# Patient Record
Sex: Female | Born: 1955 | Race: White | Hispanic: No | Smoking: Never smoker
Health system: Southern US, Community
[De-identification: ages and names within clinical notes are randomized; demographics above are authoritative.]

## PROBLEM LIST (undated history)

## (undated) DIAGNOSIS — E785 Hyperlipidemia, unspecified: Secondary | ICD-10-CM

## (undated) DIAGNOSIS — T7840XA Allergy, unspecified, initial encounter: Secondary | ICD-10-CM

## (undated) HISTORY — PX: SHOULDER ARTHROSCOPY: SHX128

## (undated) HISTORY — PX: LAPAROSCOPY: SHX197

## (undated) HISTORY — DX: Allergy, unspecified, initial encounter: T78.40XA

## (undated) HISTORY — PX: CARPAL TUNNEL RELEASE: SHX101

## (undated) HISTORY — DX: Hyperlipidemia, unspecified: E78.5

## (undated) HISTORY — PX: BREAST EXCISIONAL BIOPSY: SUR124

## (undated) HISTORY — PX: BREAST BIOPSY: SHX20

---

## 2006-11-01 ENCOUNTER — Other Ambulatory Visit: Admission: RE | Admit: 2006-11-01 | Discharge: 2006-11-01 | Payer: Self-pay | Admitting: Obstetrics and Gynecology

## 2010-05-26 ENCOUNTER — Other Ambulatory Visit: Payer: Self-pay | Admitting: Obstetrics and Gynecology

## 2010-05-26 ENCOUNTER — Other Ambulatory Visit (HOSPITAL_COMMUNITY)
Admission: RE | Admit: 2010-05-26 | Discharge: 2010-05-26 | Disposition: A | Discharge status: Home / Self Care | Payer: Managed Care, Other (non HMO) | Source: Ambulatory Visit | Attending: Obstetrics and Gynecology | Admitting: Obstetrics and Gynecology

## 2010-05-26 ENCOUNTER — Encounter (INDEPENDENT_AMBULATORY_CARE_PROVIDER_SITE_OTHER): Payer: Managed Care, Other (non HMO) | Admitting: Obstetrics and Gynecology

## 2010-05-26 DIAGNOSIS — Z124 Encounter for screening for malignant neoplasm of cervix: Secondary | ICD-10-CM | POA: Insufficient documentation

## 2010-05-26 DIAGNOSIS — R823 Hemoglobinuria: Secondary | ICD-10-CM

## 2010-05-26 DIAGNOSIS — Z01419 Encounter for gynecological examination (general) (routine) without abnormal findings: Secondary | ICD-10-CM

## 2010-06-09 ENCOUNTER — Encounter (INDEPENDENT_AMBULATORY_CARE_PROVIDER_SITE_OTHER): Payer: Managed Care, Other (non HMO)

## 2010-06-09 DIAGNOSIS — M899 Disorder of bone, unspecified: Secondary | ICD-10-CM

## 2010-10-21 ENCOUNTER — Encounter: Payer: Self-pay | Admitting: Obstetrics and Gynecology

## 2013-07-04 DIAGNOSIS — Z0000 Encounter for general adult medical examination without abnormal findings: Secondary | ICD-10-CM | POA: Insufficient documentation

## 2013-07-04 DIAGNOSIS — M545 Low back pain, unspecified: Secondary | ICD-10-CM | POA: Insufficient documentation

## 2015-09-02 DIAGNOSIS — M159 Polyosteoarthritis, unspecified: Secondary | ICD-10-CM | POA: Insufficient documentation

## 2015-09-02 DIAGNOSIS — M4317 Spondylolisthesis, lumbosacral region: Secondary | ICD-10-CM | POA: Insufficient documentation

## 2015-09-02 DIAGNOSIS — E782 Mixed hyperlipidemia: Secondary | ICD-10-CM | POA: Insufficient documentation

## 2017-05-18 ENCOUNTER — Other Ambulatory Visit: Payer: Self-pay | Admitting: Orthopedic Surgery

## 2017-05-18 DIAGNOSIS — M7582 Other shoulder lesions, left shoulder: Secondary | ICD-10-CM

## 2017-05-21 ENCOUNTER — Ambulatory Visit
Admission: RE | Admit: 2017-05-21 | Discharge: 2017-05-21 | Disposition: A | Discharge status: Home / Self Care | Payer: BLUE CROSS/BLUE SHIELD | Source: Ambulatory Visit | Attending: Orthopedic Surgery | Admitting: Orthopedic Surgery

## 2017-05-21 DIAGNOSIS — M7582 Other shoulder lesions, left shoulder: Secondary | ICD-10-CM

## 2017-06-28 DIAGNOSIS — M7582 Other shoulder lesions, left shoulder: Secondary | ICD-10-CM | POA: Insufficient documentation

## 2019-07-23 ENCOUNTER — Other Ambulatory Visit: Payer: Self-pay

## 2019-07-23 ENCOUNTER — Emergency Department
Admission: EM | Admit: 2019-07-23 | Discharge: 2019-07-23 | Disposition: A | Discharge status: Home / Self Care | Payer: 59 | Source: Home / Self Care | Attending: Family Medicine | Admitting: Family Medicine

## 2019-07-23 DIAGNOSIS — F418 Other specified anxiety disorders: Secondary | ICD-10-CM | POA: Diagnosis not present

## 2019-07-23 MED ORDER — ESCITALOPRAM OXALATE 10 MG PO TABS: ORAL_TABLET | ORAL | 1 refills | Status: DC

## 2019-07-23 NOTE — ED Provider Notes (Signed)
[facility]    CSN: [id number] Arrival date & time: 07/23/19  1152      History   Chief Complaint Chief Complaint  Patient presents with  . Anxiety    HPI Miranda Cunningham is a 64 y.o. female.   Patient reports that she has become increasingly stressed during the past several months while her 38 year-old mother has become increasingly disabled.  Her mother is home-bound with multiple medical problems/disabilty, and patient has become her primary caretaker.  She has decreased her work hours to part-time in order to care for her mother.  She feels anxious and overwhelmed despite her part-time work status.  She feels mildly depressed at times but denies suicidal thoughts.  She has difficulty concentrating and her sleep is generally adequate. She reports that she will have an initial PCP visit on 08/04/19 to establish care.   Anxiety This is a chronic problem. Episode onset: approximately 5 months. The problem occurs daily. The problem has been gradually worsening. Pertinent negatives include no chest pain, no headaches and no shortness of breath. The symptoms are aggravated by stress. Nothing relieves the symptoms. She has tried nothing for the symptoms.    History reviewed. No pertinent past medical history.  There are no problems to display for this patient.   History reviewed. No pertinent surgical history.  OB History   No obstetric history on file.      Home Medications    Prior to Admission medications   Medication Sig Start Date End Date Taking? Authorizing Provider  escitalopram (LEXAPRO) 10 MG tablet Take one tab PO daily. 07/23/19   Kandra Nicolas, MD    Family History History reviewed. No pertinent family history.  Social History Social History   Tobacco Use  . Smoking status: Never Smoker  . Smokeless tobacco: Never Used  Substance Use Topics  . Alcohol use: Not Currently  . Drug use: Not on file     Allergies   Patient has no known  allergies.   Review of Systems Review of Systems  Constitutional: Positive for fatigue. Negative for activity change, appetite change, chills, diaphoresis and unexpected weight change.  Respiratory: Negative for chest tightness and shortness of breath.   Cardiovascular: Negative for chest pain and palpitations.  Gastrointestinal: Negative for nausea.  Neurological: Negative for headaches.  Psychiatric/Behavioral: Positive for decreased concentration. Negative for agitation, confusion, sleep disturbance and suicidal ideas. The patient is nervous/anxious. The patient is not hyperactive.   All other systems reviewed and are negative.    Physical Exam Triage Vital Signs ED Triage Vitals  Enc Vitals Group     BP 07/23/19 1205 (!) 142/78     Pulse Rate 07/23/19 1205 69     Resp 07/23/19 1205 18     Temp 07/23/19 1205 98.3 F (36.8 C)     Temp Source 07/23/19 1205 Oral     SpO2 07/23/19 1205 96 %     Weight --      Height --      Head Circumference --      Peak Flow --      Pain Score 07/23/19 1208 0     Pain Loc --      Pain Edu? --      Excl. in [place]? --    No data found.  Updated Vital Signs BP (!) 142/78 (BP Location: Right Arm)   Pulse 69   Temp 98.3 F (36.8 C) (Oral)   Resp 18  SpO2 96%   Visual Acuity Right Eye Distance:   Left Eye Distance:   Bilateral Distance:    Right Eye Near:   Left Eye Near:    Bilateral Near:     Physical Exam Constitutional:      General: She is not in acute distress. HENT:     Head: Normocephalic.  Eyes:     Pupils: Pupils are equal, round, and reactive to light.  Cardiovascular:     Rate and Rhythm: Normal rate.  Pulmonary:     Effort: Pulmonary effort is normal.  Neurological:     Mental Status: She is alert.  Psychiatric:        Attention and Perception: Attention normal.        Mood and Affect: Mood normal. Affect is flat.        Speech: Speech normal.        Behavior: Behavior normal.        Thought Content:  Thought content normal. Thought content does not include suicidal ideation.        Judgment: Judgment normal.      UC Treatments / Results  Labs (all labs ordered are listed, but only abnormal results are displayed) Labs Reviewed - No data to display  EKG   Radiology No results found.  Procedures Procedures (including critical care time)  Medications Ordered in UC Medications - No data to display  Initial Impression / Assessment and Plan / UC Course  I have reviewed the triage vital signs and the nursing notes.  Pertinent labs & imaging results that were available during my care of the patient were reviewed by me and considered in my medical decision making (see chart for details).    Begin trial of Lexapro 10mg  daily (#15, one refill).  Followup with PCP scheduled on 08/04/19.   Final Clinical Impressions(s) / UC Diagnoses   Final diagnoses:  Depression with anxiety     Discharge Instructions     Consider investigating the possibility of obtaining part-time home medical assistance for the care of your mother.   ED Prescriptions    Medication Sig Dispense Auth. Provider   escitalopram (LEXAPRO) 10 MG tablet Take one tab PO daily. 15 tablet Kandra Nicolas, MD        Kandra Nicolas, MD 07/23/19 1341

## 2019-07-23 NOTE — Discharge Instructions (Addendum)
Consider investigating the possibility of obtaining part-time home medical assistance for the care of your mother.

## 2019-07-23 NOTE — ED Triage Notes (Signed)
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 Pt thinks she has ADHD and is very overwhelmed. Having increased anxiety x 2-3 mos. Has establish care appt with PCP on 21st.

## 2019-08-04 ENCOUNTER — Ambulatory Visit (INDEPENDENT_AMBULATORY_CARE_PROVIDER_SITE_OTHER): Payer: 59 | Admitting: Medical

## 2019-08-04 ENCOUNTER — Other Ambulatory Visit: Payer: Self-pay

## 2019-08-04 VITALS — BP 139/68 | HR 70 | Resp 18 | Ht 63.0 in | Wt 213.6 lb

## 2019-08-04 DIAGNOSIS — F329 Major depressive disorder, single episode, unspecified: Secondary | ICD-10-CM | POA: Diagnosis not present

## 2019-08-04 DIAGNOSIS — F32A Depression, unspecified: Secondary | ICD-10-CM

## 2019-08-04 DIAGNOSIS — F419 Anxiety disorder, unspecified: Secondary | ICD-10-CM

## 2019-08-04 MED ORDER — ESCITALOPRAM OXALATE 10 MG PO TABS: 10.0000 mg | ORAL_TABLET | Freq: Every day | ORAL | 0 refills | Status: DC

## 2019-08-04 NOTE — Progress Notes (Signed)
Subjective:    Patient ID: Miranda Cunningham, female    DOB: December 05, 1955, 64 y.o.   MRN: [id number]  HPI  Pt in for first time.  Pt from area. Pt has [facility] [employer].  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 Pt does not exercise regularly. Pt states often eats vegetable soup often. Nonsmoker, no alcohol. Drinks 2-3 cups of coffee a day.  Pt just recently been anxious and depressed mood x 5 months. States just occurred recently with moms health issues. Pt is stressed having to take care of mom.  Pt mom is 34 year old.   Pt has some anxiety and depression in past brief and self limited but recent anxious and depressed.   Pt easily frustrated with mom. Some concern mom may have dementia.  At ED recently on July 23, 2019 she started lexapro. She states anxiety and depression improved but not resolved.    Review of Systems  Constitutional: Negative for chills, fatigue and fever.  Respiratory: Negative for cough, chest tightness, shortness of breath and wheezing.   Cardiovascular: Negative for chest pain and palpitations.  Gastrointestinal: Negative for abdominal pain.  Musculoskeletal: Negative for back pain, joint swelling and neck stiffness.  Skin: Negative for rash.  Neurological: Negative for dizziness and light-headedness.  Hematological: Negative for adenopathy. Does not bruise/bleed easily.  Psychiatric/Behavioral: Positive for dysphoric mood. Negative for behavioral problems, confusion, sleep disturbance and suicidal ideas. The patient is nervous/anxious.     No past medical history on file.   Social History   Socioeconomic History  . Marital status: Divorced    Spouse name: Not on file  . Number of children: Not on file  . Years of education: Not on file  . Highest education level: Not on file  Occupational History  . Not on file  Tobacco Use  . Smoking status: Never Smoker  . Smokeless tobacco: Never Used  Vaping Use  . Vaping Use: Never  used  Substance and Sexual Activity  . Alcohol use: Not Currently  . Drug use: Not on file  . Sexual activity: Not on file  Other Topics Concern  . Not on file  Social History Narrative  . Not on file   Social Determinants of Health   Financial Resource Strain:   . Difficulty of Paying Living Expenses:   Food Insecurity:   . Worried About Charity fundraiser in the Last Year:   . Arboriculturist in the Last Year:   Transportation Needs:   . Film/video editor (Medical):   Marland Kitchen Lack of Transportation (Non-Medical):   Physical Activity:   . Days of Exercise per Week:   . Minutes of Exercise per Session:   Stress:   . Feeling of Stress :   Social Connections:   . Frequency of Communication with Friends and Family:   . Frequency of Social Gatherings with Friends and Family:   . Attends Religious Services:   . Active Member of Clubs or Organizations:   . Attends Archivist Meetings:   Marland Kitchen Marital Status:   Intimate Partner Violence:   . Fear of Current or Ex-Partner:   . Emotionally Abused:   Marland Kitchen Physically Abused:   . Sexually Abused:     No past surgical history on file.  No family history on file.  No Known Allergies  Current Outpatient Medications on File Prior to Visit  Medication Sig Dispense Refill  . escitalopram (LEXAPRO) 10 MG tablet  Take one tab PO daily. 15 tablet 1   No current facility-administered medications on file prior to visit.    BP 139/68 (BP Location: Left Arm, Patient Position: Sitting, Cuff Size: Large)   Pulse 70   Resp 18   Ht 5\' 3"  (1.6 m)   Wt 213 lb 9.6 oz (96.9 kg)   SpO2 93%   BMI 37.84 kg/m       Objective:   Physical Exam  General Mental Status- Alert. General Appearance- Not in acute distress.   Skin General: Color- Normal Color. Moisture- Normal Moisture.  Neck Carotid Arteries- Normal color. Moisture- Normal Moisture. No carotid bruits. No JVD.  Chest and Lung Exam Auscultation: Breath  Sounds:-Normal.  Cardiovascular Auscultation:Rythm- Regular. Murmurs & Other Heart Sounds:Auscultation of the heart reveals- No Murmurs.  Abdomen Inspection:-Inspeection Normal. Palpation/Percussion:Note:No mass. Palpation and Percussion of the abdomen reveal- Non Tender, Non Distended + BS, no rebound or guarding.  Neurologic Cranial Nerve exam:- CN III-XII intact(No nystagmus), symmetric smile. Strength:- 5/5 equal and symmetric strength both upper and lower extremities.      Assessment & Plan:  Your anxiety and depression has improved moderately. We discussed possible dose increase. You want to stay on current low dose. So refill that. If your mood worsens or change let us know. Could increase dose in future.  For weight loss recommend try weight watchers diet.  Do want you to schedule cpe/wellness or as needed.  Time spent with patient today was 30  minutes which consisted of  discussing diagnosis,  Treatment, coaching on weight loss and documentation.  Mackie Pai, PA-C

## 2019-08-04 NOTE — Patient Instructions (Addendum)
Your anxiety and depression has improved moderately. We discussed possible dose increase. You want to stay on current low dose. So refill that. If your mood worsens or change let us know. Could increase dose in future.  For weight loss recommend try weight watchers diet.  Do want you to schedule cpe/wellness or as needed.

## 2019-08-28 ENCOUNTER — Ambulatory Visit (INDEPENDENT_AMBULATORY_CARE_PROVIDER_SITE_OTHER): Payer: 59 | Admitting: Medical

## 2019-08-28 ENCOUNTER — Other Ambulatory Visit: Payer: Self-pay

## 2019-08-28 VITALS — BP 112/80 | HR 69 | Temp 98.8°F | Resp 18 | Ht 63.0 in | Wt 211.0 lb

## 2019-08-28 DIAGNOSIS — Z1231 Encounter for screening mammogram for malignant neoplasm of breast: Secondary | ICD-10-CM

## 2019-08-28 DIAGNOSIS — Z0000 Encounter for general adult medical examination without abnormal findings: Secondary | ICD-10-CM | POA: Diagnosis not present

## 2019-08-28 DIAGNOSIS — Z1211 Encounter for screening for malignant neoplasm of colon: Secondary | ICD-10-CM

## 2019-08-28 DIAGNOSIS — Z1283 Encounter for screening for malignant neoplasm of skin: Secondary | ICD-10-CM

## 2019-08-28 DIAGNOSIS — Z124 Encounter for screening for malignant neoplasm of cervix: Secondary | ICD-10-CM | POA: Diagnosis not present

## 2019-08-28 LAB — COMPREHENSIVE METABOLIC PANEL
ALT: 16 U/L (ref 0–35)
AST: 13 U/L (ref 0–37)
Albumin: 4 g/dL (ref 3.5–5.2)
Alkaline Phosphatase: 59 U/L (ref 39–117)
BUN: 13 mg/dL (ref 6–23)
CO2: 31 mEq/L (ref 19–32)
Calcium: 9.2 mg/dL (ref 8.4–10.5)
Chloride: 105 mEq/L (ref 96–112)
Creatinine, Ser: 0.75 mg/dL (ref 0.40–1.20)
GFR: 77.98 mL/min (ref >60.00)
Glucose, Bld: 101 mg/dL — ABNORMAL HIGH (ref 70–99)
Potassium: 4.2 mEq/L (ref 3.5–5.1)
Sodium: 141 mEq/L (ref 135–145)
Total Bilirubin: 0.5 mg/dL (ref 0.2–1.2)
Total Protein: 6.3 g/dL (ref 6.0–8.3)

## 2019-08-28 LAB — CBC WITH DIFFERENTIAL/PLATELET
Basophils Absolute: 0 10*3/uL (ref 0.0–0.1)
Basophils Relative: 0.4 % (ref 0.0–3.0)
Eosinophils Absolute: 0.1 10*3/uL (ref 0.0–0.7)
Eosinophils Relative: 1.2 % (ref 0.0–5.0)
HCT: 36.4 % (ref 36.0–46.0)
Hemoglobin: 12.1 g/dL (ref 12.0–15.0)
Lymphocytes Relative: 17.6 % (ref 12.0–46.0)
Lymphs Abs: 1.3 10*3/uL (ref 0.7–4.0)
MCHC: 33.2 g/dL (ref 30.0–36.0)
MCV: 95.3 fl (ref 78.0–100.0)
Monocytes Absolute: 0.5 10*3/uL (ref 0.1–1.0)
Monocytes Relative: 6.7 % (ref 3.0–12.0)
Neutro Abs: 5.4 10*3/uL (ref 1.4–7.7)
Neutrophils Relative %: 74.1 % (ref 43.0–77.0)
Platelets: 264 10*3/uL (ref 150.0–400.0)
RBC: 3.82 Mil/uL — ABNORMAL LOW (ref 3.87–5.11)
RDW: 14.1 % (ref 11.5–15.5)
WBC: 7.3 10*3/uL (ref 4.0–10.5)

## 2019-08-28 LAB — LIPID PANEL
Cholesterol: 203 mg/dL — ABNORMAL HIGH (ref 0–200)
HDL: 38.2 mg/dL — ABNORMAL LOW (ref >39.00)
LDL Cholesterol: 133 mg/dL — ABNORMAL HIGH (ref 0–99)
NonHDL: 164.32
Total CHOL/HDL Ratio: 5
Triglycerides: 159 mg/dL — ABNORMAL HIGH (ref 0.0–149.0)
VLDL: 31.8 mg/dL (ref 0.0–40.0)

## 2019-08-28 NOTE — Patient Instructions (Addendum)
For you wellness exam today I have ordered cbc, cmp, lipid panel,  and hiv.  Vaccine appear up to date.  Recommend exercise and healthy diet.  We will let you know lab results as they come in.  Follow up date appointment will be determined after lab review.   Refer to derm for skin cancer screening.   Preventive Care 83-63 Years Old, Female Preventive care refers to visits with your health care provider and lifestyle choices that can promote health and wellness. This includes:  A yearly physical exam. This may also be called an annual well check.  Regular dental visits and eye exams.  Immunizations.  Screening for certain conditions.  Healthy lifestyle choices, such as eating a healthy diet, getting regular exercise, not using drugs or products that contain nicotine and tobacco, and limiting alcohol use. What can I expect for my preventive care visit? Physical exam Your health care provider will check your:  Height and weight. This may be used to calculate body mass index (BMI), which tells if you are at a healthy weight.  Heart rate and blood pressure.  Skin for abnormal spots. Counseling Your health care provider may ask you questions about your:  Alcohol, tobacco, and drug use.  Emotional well-being.  Home and relationship well-being.  Sexual activity.  Eating habits.  Work and work Astronomer.  Method of birth control.  Menstrual cycle.  Pregnancy history. What immunizations do I need?  Influenza (flu) vaccine  This is recommended every year. Tetanus, diphtheria, and pertussis (Tdap) vaccine  You may need a Td booster every 10 years. Varicella (chickenpox) vaccine  You may need this if you have not been vaccinated. Zoster (shingles) vaccine  You may need this after age 27. Measles, mumps, and rubella (MMR) vaccine  You may need at least one dose of MMR if you were born in 1957 or later. You may also need a second dose. Pneumococcal conjugate  (PCV13) vaccine  You may need this if you have certain conditions and were not previously vaccinated. Pneumococcal polysaccharide (PPSV23) vaccine  You may need one or two doses if you smoke cigarettes or if you have certain conditions. Meningococcal conjugate (MenACWY) vaccine  You may need this if you have certain conditions. Hepatitis A vaccine  You may need this if you have certain conditions or if you travel or work in places where you may be exposed to hepatitis A. Hepatitis B vaccine  You may need this if you have certain conditions or if you travel or work in places where you may be exposed to hepatitis B. Haemophilus influenzae type b (Hib) vaccine  You may need this if you have certain conditions. Human papillomavirus (HPV) vaccine  If recommended by your health care provider, you may need three doses over 6 months. You may receive vaccines as individual doses or as more than one vaccine together in one shot (combination vaccines). Talk with your health care provider about the risks and benefits of combination vaccines. What tests do I need? Blood tests  Lipid and cholesterol levels. These may be checked every 5 years, or more frequently if you are over 8 years old.  Hepatitis C test.  Hepatitis B test. Screening  Lung cancer screening. You may have this screening every year starting at age 55 if you have a 30-pack-year history of smoking and currently smoke or have quit within the past 15 years.  Colorectal cancer screening. All adults should have this screening starting at age 22 and continuing  until age 70. Your health care provider may recommend screening at age 11 if you are at increased risk. You will have tests every 1-10 years, depending on your results and the type of screening test.  Diabetes screening. This is done by checking your blood sugar (glucose) after you have not eaten for a while (fasting). You may have this done every 1-3 years.  Mammogram. This  may be done every 1-2 years. Talk with your health care provider about when you should start having regular mammograms. This may depend on whether you have a family history of breast cancer.  BRCA-related cancer screening. This may be done if you have a family history of breast, ovarian, tubal, or peritoneal cancers.  Pelvic exam and Pap test. This may be done every 3 years starting at age 81. Starting at age 38, this may be done every 5 years if you have a Pap test in combination with an HPV test. Other tests  Sexually transmitted disease (STD) testing.  Bone density scan. This is done to screen for osteoporosis. You may have this scan if you are at high risk for osteoporosis. Follow these instructions at home: Eating and drinking  Eat a diet that includes fresh fruits and vegetables, whole grains, lean protein, and low-fat dairy.  Take vitamin and mineral supplements as recommended by your health care provider.  Do not drink alcohol if: ? Your health care provider tells you not to drink. ? You are pregnant, may be pregnant, or are planning to become pregnant.  If you drink alcohol: ? Limit how much you have to 0-1 drink a day. ? Be aware of how much alcohol is in your drink. In the [place] one drink equals one 12 oz bottle of beer (355 mL), one 5 oz glass of wine (148 mL), or one 1 oz glass of hard liquor (44 mL). Lifestyle  Take daily care of your teeth and gums.  Stay active. Exercise for at least 30 minutes on 5 or more days each week.  Do not use any products that contain nicotine or tobacco, such as cigarettes, e-cigarettes, and chewing tobacco. If you need help quitting, ask your health care provider.  If you are sexually active, practice safe sex. Use a condom or other form of birth control (contraception) in order to prevent pregnancy and STIs (sexually transmitted infections).  If told by your health care provider, take low-dose aspirin daily starting at age 49. What's  next?  Visit your health care provider once a year for a well check visit.  Ask your health care provider how often you should have your eyes and teeth checked.  Stay up to date on all vaccines. This information is not intended to replace advice given to you by your health care provider. Make sure you discuss any questions you have with your health care provider. Document Revised: 10/11/2017 Document Reviewed: 10/11/2017 Elsevier Patient Education  2020 [employer].

## 2019-08-28 NOTE — Addendum Note (Signed)
Addended by: Anabel Halon on: 08/28/2019 09:39 AM   Modules accepted: Orders

## 2019-08-28 NOTE — Progress Notes (Signed)
Subjective:    Patient ID: Miranda Cunningham, female    DOB: 1955/07/06, 64 y.o.   MRN: [id number]  HPI   Pt in for cpe/wellness.  She is fasting.   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 Pt does not exercise regularly. Pt states often eats vegetable soup often. Nonsmoker, no alcohol. Drinks 2-3 cups of coffee a day.  Pt has not gotten covid vaccine. States she won't get.  Pt has not had mammogram. Will get so placed order.  Pt has not had pap in 3 or more years. States hx of abnormal but on work up and repeats were negative.    Review of Systems  Constitutional: Negative for chills, fatigue and fever.  Respiratory: Negative for cough and chest tightness.   Cardiovascular: Negative for chest pain and palpitations.  Gastrointestinal: Negative for abdominal pain.  Musculoskeletal: Negative for back pain, gait problem and neck pain.  Skin: Negative for rash.  Psychiatric/Behavioral: Negative for behavioral problems and dysphoric mood. The patient is not nervous/anxious.        Anxiety and mood presently controlled.    No past medical history on file.   Social History   Socioeconomic History  . Marital status: Divorced    Spouse name: Not on file  . Number of children: Not on file  . Years of education: Not on file  . Highest education level: Not on file  Occupational History  . Not on file  Tobacco Use  . Smoking status: Never Smoker  . Smokeless tobacco: Never Used  Vaping Use  . Vaping Use: Never used  Substance and Sexual Activity  . Alcohol use: Not Currently  . Drug use: Not on file  . Sexual activity: Not on file  Other Topics Concern  . Not on file  Social History Narrative  . Not on file   Social Determinants of Health   Financial Resource Strain:   . Difficulty of Paying Living Expenses:   Food Insecurity:   . Worried About Charity fundraiser in the Last Year:   . Arboriculturist in the Last Year:   Transportation  Needs:   . Film/video editor (Medical):   Marland Kitchen Lack of Transportation (Non-Medical):   Physical Activity:   . Days of Exercise per Week:   . Minutes of Exercise per Session:   Stress:   . Feeling of Stress :   Social Connections:   . Frequency of Communication with Friends and Family:   . Frequency of Social Gatherings with Friends and Family:   . Attends Religious Services:   . Active Member of Clubs or Organizations:   . Attends Archivist Meetings:   Marland Kitchen Marital Status:   Intimate Partner Violence:   . Fear of Current or Ex-Partner:   . Emotionally Abused:   Marland Kitchen Physically Abused:   . Sexually Abused:     No past surgical history on file.  No family history on file.  No Known Allergies  Current Outpatient Medications on File Prior to Visit  Medication Sig Dispense Refill  . escitalopram (LEXAPRO) 10 MG tablet Take one tab PO daily. 15 tablet 1  . escitalopram (LEXAPRO) 10 MG tablet Take 1 tablet (10 mg total) by mouth at bedtime. 30 tablet 0   No current facility-administered medications on file prior to visit.    BP 112/80 (BP Location: Left Arm, Patient Position: Sitting, Cuff Size: Large)   Pulse 69   Temp  98.8 F (37.1 C) (Oral)   Resp 18   Ht 5\' 3"  (1.6 m)   Wt 211 lb (95.7 kg)   SpO2 97%   BMI 37.38 kg/m       Objective:   Physical Exam  General Mental Status- Alert. General Appearance- Not in acute distress.   Skin General: Color- Normal Color. Moisture- Normal Moisture. Some fair skin. Scattered freckles. Moderate sized moles.  Neck Carotid Arteries- Normal color. Moisture- Normal Moisture. No carotid bruits. No JVD.  Chest and Lung Exam Auscultation: Breath Sounds:-Normal.  Cardiovascular Auscultation:Rythm- Regular. Murmurs & Other Heart Sounds:Auscultation of the heart reveals- No Murmurs.  Abdomen Inspection:-Inspeection Normal. Palpation/Percussion:Note:No mass. Palpation and Percussion of the abdomen reveal- Non Tender,  Non Distended + BS, no rebound or guarding.   Neurologic Cranial Nerve exam:- CN III-XII intact(No nystagmus), symmetric smile. Strength:- 5/5 equal and symmetric strength both upper and lower extremities.      Assessment & Plan:  For you wellness exam today I have ordered cbc, cmp, lipid panel,  and hiv.  Vaccine appear up to date.  Recommend exercise and healthy diet.  We will let you know lab results as they come in.  Follow up date appointment will be determined after lab review.   Mackie Pai, PA-C

## 2019-09-10 ENCOUNTER — Ambulatory Visit (HOSPITAL_BASED_OUTPATIENT_CLINIC_OR_DEPARTMENT_OTHER)
Admission: RE | Admit: 2019-09-10 | Discharge: 2019-09-10 | Disposition: A | Discharge status: Home / Self Care | Payer: 59 | Source: Ambulatory Visit | Attending: Medical | Admitting: Medical

## 2019-09-10 ENCOUNTER — Other Ambulatory Visit: Payer: Self-pay

## 2019-09-10 ENCOUNTER — Encounter (HOSPITAL_BASED_OUTPATIENT_CLINIC_OR_DEPARTMENT_OTHER): Payer: Self-pay

## 2019-09-10 DIAGNOSIS — Z1231 Encounter for screening mammogram for malignant neoplasm of breast: Secondary | ICD-10-CM | POA: Diagnosis not present

## 2019-10-24 ENCOUNTER — Encounter: Payer: Self-pay | Admitting: Medical

## 2020-09-09 ENCOUNTER — Other Ambulatory Visit (HOSPITAL_BASED_OUTPATIENT_CLINIC_OR_DEPARTMENT_OTHER): Payer: Self-pay | Admitting: Medical

## 2020-09-09 DIAGNOSIS — Z1231 Encounter for screening mammogram for malignant neoplasm of breast: Secondary | ICD-10-CM

## 2020-09-25 ENCOUNTER — Other Ambulatory Visit: Payer: Self-pay

## 2020-09-25 ENCOUNTER — Emergency Department
Admission: EM | Admit: 2020-09-25 | Discharge: 2020-09-25 | Disposition: A | Discharge status: Home / Self Care | Payer: 59 | Source: Home / Self Care

## 2020-09-25 DIAGNOSIS — B372 Candidiasis of skin and nail: Secondary | ICD-10-CM | POA: Diagnosis not present

## 2020-09-25 MED ORDER — NYSTATIN 100000 UNIT/GM EX CREA
TOPICAL_CREAM | CUTANEOUS | 0 refills | Status: DC
Start: 2020-09-25 — End: 2021-02-22

## 2020-09-25 MED ORDER — METHYLPREDNISOLONE ACETATE 80 MG/ML IJ SUSP
80.0000 mg | Freq: Once | INTRAMUSCULAR | Status: AC
  Administered 2020-09-25: 80 mg via INTRAMUSCULAR

## 2020-09-25 NOTE — ED Provider Notes (Signed)
Miranda [facility] C[facility]    CSN: [id number] Arrival date & time: 09/25/20  1455      History   Chief Complaint Chief Complaint  Patient presents with   Rash    Underneath breasts    HPI Miranda Cunningham is a 65 y.o. female.   HPI 65 year old female presents with rash under bilateral breast for 2 weeks.  Reports cortisone cream as needed provided little to no relief.  History reviewed. No pertinent past medical history.  There are no problems to display for this patient.   Past Surgical History:  Procedure Laterality Date   BREAST BIOPSY      OB History   No obstetric history on file.      Home Medications    Prior to Admission medications   Medication Sig Start Date End Date Taking? Authorizing Provider  nystatin cream (MYCOSTATIN) Apply to affected area of inframammary crease 2 times daily x 10 days, as needed 09/25/20  Yes Eliezer Lofts, FNP  escitalopram (LEXAPRO) 10 MG tablet Take one tab PO daily. 07/23/19   Kandra Nicolas, MD  escitalopram (LEXAPRO) 10 MG tablet Take 1 tablet (10 mg total) by mouth at bedtime. 08/04/19   Saguier, Iris Pert    Family History History reviewed. No pertinent family history.  Social History Social History   Tobacco Use   Smoking status: Never   Smokeless tobacco: Never  Vaping Use   Vaping Use: Never used  Substance Use Topics   Alcohol use: Not Currently     Allergies   Patient has no known allergies.   Review of Systems Review of Systems  Skin:  Positive for rash.  All other systems reviewed and are negative.   Physical Exam Triage Vital Signs ED Triage Vitals  Enc Vitals Group     BP 09/25/20 1520 124/69     Pulse Rate 09/25/20 1520 (!) 59     Resp 09/25/20 1520 18     Temp 09/25/20 1520 98.4 F (36.9 C)     Temp Source 09/25/20 1520 Oral     SpO2 09/25/20 1520 97 %     Weight --      Height --      Head Circumference --      Peak Flow --      Pain Score 09/25/20 1521 0     Pain Loc --       Pain Edu? --      Excl. in [place]? --    No data found.  Updated Vital Signs BP 124/69 (BP Location: Left Arm)   Pulse (!) 59   Temp 98.4 F (36.9 C) (Oral)   Resp 18   SpO2 97%    Physical Exam Vitals and nursing note reviewed. Exam conducted with a chaperone present.  Constitutional:      General: She is not in acute distress.    Appearance: She is obese. She is not ill-appearing.  HENT:     Head: Normocephalic and atraumatic.     Mouth/Throat:     Mouth: Mucous membranes are moist.     Pharynx: Oropharynx is clear.  Eyes:     Extraocular Movements: Extraocular movements intact.     Conjunctiva/sclera: Conjunctivae normal.     Pupils: Pupils are equal, round, and reactive to light.  Cardiovascular:     Rate and Rhythm: Normal rate and regular rhythm.     Pulses: Normal pulses.     Heart sounds: Normal heart sounds. No murmur heard.  Pulmonary:     Effort: Pulmonary effort is normal.     Breath sounds: Normal breath sounds. No wheezing, rhonchi or rales.  Musculoskeletal:        General: Normal range of motion.     Cervical back: Normal range of motion and neck supple. No tenderness.  Lymphadenopathy:     Cervical: No cervical adenopathy.  Skin:    General: Skin is warm and dry.     Comments: Bilateral inframammary crease: Erythematous, macerated, plaques, erosions, scaling, with delicate peripheral papulopustules noted  Neurological:     General: No focal deficit present.     Mental Status: She is alert. Mental status is at baseline.  Psychiatric:        Mood and Affect: Mood normal.        Behavior: Behavior normal.        Thought Content: Thought content normal.     UC Treatments / Results  Labs (all labs ordered are listed, but only abnormal results are displayed) Labs Reviewed - No data to display  EKG   Radiology No results found.  Procedures Procedures (including critical care time)  Medications Ordered in UC Medications  methylPREDNISolone  acetate (DEPO-MEDROL) injection 80 mg (has no administration in time range)    Initial Impression / Assessment and Plan / UC Course  I have reviewed the triage vital signs and the nursing notes.  Pertinent labs & imaging results that were available during my care of the patient were reviewed by me and considered in my medical decision making (see chart for details).     MDM: 1.  Candidal intertrigo-IM Depo Medrol 80 given once in clinic today, Rx'd Mycolog-II, Advised patient to use medication as directed for the next 7 to 10 days.  Advised/encouraged patient to keep area dry as possible, advised OTC Zeasorb to enhance dryness.  Patient discharged home, hemodynamically stable. Final Clinical Impressions(s) / UC Diagnoses   Final diagnoses:  Candidal intertrigo     Discharge Instructions      Advised patient to use medication as directed for the next 7 to 10 days.  Advised/encouraged patient to keep area dry as possible, advised OTC Zeasorb to enhance dryness.     ED Prescriptions     Medication Sig Dispense Auth. Provider   nystatin cream (MYCOSTATIN) Apply to affected area of inframammary crease 2 times daily x 10 days, as needed 30 g Eliezer Lofts, FNP      PDMP not reviewed this encounter.   Eliezer Lofts, [employer] 09/25/20 [phone]

## 2020-09-25 NOTE — Discharge Instructions (Addendum)
Advised patient to use medication as directed for the next 7 to 10 days.  Advised/encouraged patient to keep area dry as possible, advised OTC Zeasorb to enhance dryness.

## 2020-09-25 NOTE — ED Triage Notes (Addendum)
Pt c/o rash underneath both breasts x 2 weeks. Very itchy. Cortizone cream prn with little relief.

## 2020-10-04 ENCOUNTER — Other Ambulatory Visit: Payer: Self-pay

## 2020-10-04 ENCOUNTER — Encounter (HOSPITAL_BASED_OUTPATIENT_CLINIC_OR_DEPARTMENT_OTHER): Payer: Self-pay

## 2020-10-04 ENCOUNTER — Ambulatory Visit (HOSPITAL_BASED_OUTPATIENT_CLINIC_OR_DEPARTMENT_OTHER)
Admission: RE | Admit: 2020-10-04 | Discharge: 2020-10-04 | Disposition: A | Discharge status: Home / Self Care | Payer: 59 | Source: Ambulatory Visit | Attending: Medical | Admitting: Medical

## 2020-10-04 DIAGNOSIS — Z1231 Encounter for screening mammogram for malignant neoplasm of breast: Secondary | ICD-10-CM | POA: Diagnosis not present

## 2020-10-06 ENCOUNTER — Telehealth: Payer: Self-pay

## 2020-10-06 NOTE — Telephone Encounter (Signed)
Called to request nystatin refill. Per Dr Meda Coffee, no refill necessary. Can try OTC clotrimazole from now on.

## 2020-11-30 ENCOUNTER — Ambulatory Visit (INDEPENDENT_AMBULATORY_CARE_PROVIDER_SITE_OTHER): Payer: 59 | Admitting: Medical

## 2020-11-30 ENCOUNTER — Other Ambulatory Visit: Payer: Self-pay

## 2020-11-30 ENCOUNTER — Encounter: Payer: 59 | Admitting: Advanced Practice Midwife

## 2020-11-30 VITALS — BP 133/66 | HR 76 | Temp 97.9°F | Resp 18 | Ht 63.0 in | Wt 203.0 lb

## 2020-11-30 DIAGNOSIS — Z1159 Encounter for screening for other viral diseases: Secondary | ICD-10-CM | POA: Diagnosis not present

## 2020-11-30 DIAGNOSIS — Z113 Encounter for screening for infections with a predominantly sexual mode of transmission: Secondary | ICD-10-CM

## 2020-11-30 DIAGNOSIS — Z0000 Encounter for general adult medical examination without abnormal findings: Secondary | ICD-10-CM | POA: Diagnosis not present

## 2020-11-30 DIAGNOSIS — Z1283 Encounter for screening for malignant neoplasm of skin: Secondary | ICD-10-CM | POA: Diagnosis not present

## 2020-11-30 DIAGNOSIS — Z1211 Encounter for screening for malignant neoplasm of colon: Secondary | ICD-10-CM | POA: Diagnosis not present

## 2020-11-30 DIAGNOSIS — F32A Depression, unspecified: Secondary | ICD-10-CM

## 2020-11-30 LAB — LIPID PANEL
Cholesterol: 244 mg/dL — ABNORMAL HIGH (ref 0–200)
HDL: 49.6 mg/dL (ref >39.00)
LDL Cholesterol: 174 mg/dL — ABNORMAL HIGH (ref 0–99)
NonHDL: 193.97
Total CHOL/HDL Ratio: 5
Triglycerides: 102 mg/dL (ref 0.0–149.0)
VLDL: 20.4 mg/dL (ref 0.0–40.0)

## 2020-11-30 LAB — COMPREHENSIVE METABOLIC PANEL
ALT: 18 U/L (ref 0–35)
AST: 14 U/L (ref 0–37)
Albumin: 4 g/dL (ref 3.5–5.2)
Alkaline Phosphatase: 66 U/L (ref 39–117)
BUN: 12 mg/dL (ref 6–23)
CO2: 30 mEq/L (ref 19–32)
Calcium: 9.2 mg/dL (ref 8.4–10.5)
Chloride: 104 mEq/L (ref 96–112)
Creatinine, Ser: 0.64 mg/dL (ref 0.40–1.20)
GFR: 93.31 mL/min (ref >60.00)
Glucose, Bld: 90 mg/dL (ref 70–99)
Potassium: 4.3 mEq/L (ref 3.5–5.1)
Sodium: 140 mEq/L (ref 135–145)
Total Bilirubin: 0.5 mg/dL (ref 0.2–1.2)
Total Protein: 6.6 g/dL (ref 6.0–8.3)

## 2020-11-30 LAB — CBC WITH DIFFERENTIAL/PLATELET
Basophils Absolute: 0.1 10*3/uL (ref 0.0–0.1)
Basophils Relative: 0.8 % (ref 0.0–3.0)
Eosinophils Absolute: 0.1 10*3/uL (ref 0.0–0.7)
Eosinophils Relative: 0.9 % (ref 0.0–5.0)
HCT: 38.1 % (ref 36.0–46.0)
Hemoglobin: 12.6 g/dL (ref 12.0–15.0)
Lymphocytes Relative: 18.9 % (ref 12.0–46.0)
Lymphs Abs: 1.4 10*3/uL (ref 0.7–4.0)
MCHC: 33 g/dL (ref 30.0–36.0)
MCV: 93.8 fl (ref 78.0–100.0)
Monocytes Absolute: 0.4 10*3/uL (ref 0.1–1.0)
Monocytes Relative: 5.7 % (ref 3.0–12.0)
Neutro Abs: 5.5 10*3/uL (ref 1.4–7.7)
Neutrophils Relative %: 73.7 % (ref 43.0–77.0)
Platelets: 276 10*3/uL (ref 150.0–400.0)
RBC: 4.06 Mil/uL (ref 3.87–5.11)
RDW: 13.9 % (ref 11.5–15.5)
WBC: 7.5 10*3/uL (ref 4.0–10.5)

## 2020-11-30 NOTE — Patient Instructions (Addendum)
For you wellness exam today I have ordered cbc, cmp, hiv, hep c antibody and lipid panel.  Vaccines declined. Flu, covid and shingles vaccine.  Recommend exercise and healthy diet.  We will let you know lab results as they come in.  Follow up date one month to see how responded to lexapro. Sooner if needed.  Refer to colonoscopy vs cologuard.  Keep appointment with gyn for pap.  For depression rx lexapro 10 mg daily.  Preventive Care 35-64 Years Old, Female Preventive care refers to lifestyle choices and visits with your health care provider that can promote health and wellness. This includes: A yearly physical exam. This is also called an annual wellness visit. Regular dental and eye exams. Immunizations. Screening for certain conditions. Healthy lifestyle choices, such as: Eating a healthy diet. Getting regular exercise. Not using drugs or products that contain nicotine and tobacco. Limiting alcohol use. What can I expect for my preventive care visit? Physical exam Your health care provider will check your: Height and weight. These may be used to calculate your BMI (body mass index). BMI is a measurement that tells if you are at a healthy weight. Heart rate and blood pressure. Body temperature. Skin for abnormal spots. Counseling Your health care provider may ask you questions about your: Past medical problems. Family's medical history. Alcohol, tobacco, and drug use. Emotional well-being. Home life and relationship well-being. Sexual activity. Diet, exercise, and sleep habits. Work and work Statistician. Access to firearms. Method of birth control. Menstrual cycle. Pregnancy history. What immunizations do I need? Vaccines are usually given at various ages, according to a schedule. Your health care provider will recommend vaccines for you based on your age, medical history, and lifestyle or other factors, such as travel or where you work. What tests do I  need? Blood tests Lipid and cholesterol levels. These may be checked every 5 years, or more often if you are over 66 years old. Hepatitis C test. Hepatitis B test. Screening Lung cancer screening. You may have this screening every year starting at age 34 if you have a 30-pack-year history of smoking and currently smoke or have quit within the past 15 years. Colorectal cancer screening. All adults should have this screening starting at age 58 and continuing until age 55. Your health care provider may recommend screening at age 25 if you are at increased risk. You will have tests every 1-10 years, depending on your results and the type of screening test. Diabetes screening. This is done by checking your blood sugar (glucose) after you have not eaten for a while (fasting). You may have this done every 1-3 years. Mammogram. This may be done every 1-2 years. Talk with your health care provider about when you should start having regular mammograms. This may depend on whether you have a family history of breast cancer. BRCA-related cancer screening. This may be done if you have a family history of breast, ovarian, tubal, or peritoneal cancers. Pelvic exam and Pap test. This may be done every 3 years starting at age 15. Starting at age 70, this may be done every 5 years if you have a Pap test in combination with an HPV test. Other tests STD (sexually transmitted disease) testing, if you are at risk. Bone density scan. This is done to screen for osteoporosis. You may have this scan if you are at high risk for osteoporosis. Talk with your health care provider about your test results, treatment options, and if necessary, the need for more  tests. Follow these instructions at home: Eating and drinking  Eat a diet that includes fresh fruits and vegetables, whole grains, lean protein, and low-fat dairy products. Take vitamin and mineral supplements as recommended by your health care provider. Do not  drink alcohol if: Your health care provider tells you not to drink. You are pregnant, may be pregnant, or are planning to become pregnant. If you drink alcohol: Limit how much you have to 0-1 drink a day. Be aware of how much alcohol is in your drink. In the [place] one drink equals one 12 oz bottle of beer (355 mL), one 5 oz glass of wine (148 mL), or one 1 oz glass of hard liquor (44 mL). Lifestyle Take daily care of your teeth and gums. Brush your teeth every morning and night with fluoride toothpaste. Floss one time each day. Stay active. Exercise for at least 30 minutes 5 or more days each week. Do not use any products that contain nicotine or tobacco, such as cigarettes, e-cigarettes, and chewing tobacco. If you need help quitting, ask your health care provider. Do not use drugs. If you are sexually active, practice safe sex. Use a condom or other form of protection to prevent STIs (sexually transmitted infections). If you do not wish to become pregnant, use a form of birth control. If you plan to become pregnant, see your health care provider for a prepregnancy visit. If told by your health care provider, take low-dose aspirin daily starting at age 8. Find healthy ways to cope with stress, such as: Meditation, yoga, or listening to music. Journaling. Talking to a trusted person. Spending time with friends and family. Safety Always wear your seat belt while driving or riding in a vehicle. Do not drive: If you have been drinking alcohol. Do not ride with someone who has been drinking. When you are tired or distracted. While texting. Wear a helmet and other protective equipment during sports activities. If you have firearms in your house, make sure you follow all gun safety procedures. What's next? Visit your health care provider once a year for an annual wellness visit. Ask your health care provider how often you should have your eyes and teeth checked. Stay up to date on all  vaccines. This information is not intended to replace advice given to you by your health care provider. Make sure you discuss any questions you have with your health care provider. Document Revised: 04/09/2020 Document Reviewed: 10/11/2017 Elsevier Patient Education  2022 [employer].

## 2020-11-30 NOTE — Progress Notes (Signed)
   Subjective:    Patient ID: Miranda Cunningham, female    DOB: Mar 08, 1955, 65 y.o.   MRN: [id number]  HPI  Pt in for cpe/wellness.   She is fasting.    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 Pt does not exercise regularly. Pt states often eats vegetable soup often. Nonsmoker, no alcohol. Drinks 2-3 cups of coffee a day.   Pt has not gotten covid vaccine. States she won't get.   Pt has had mammogram 10/05/2020.   IMPRESSION: No mammographic evidence of malignancy. A result letter of this screening mammogram will be mailed directly to the patient.   RECOMMENDATION: Screening mammogram in one year. (Code:[id number])   BI-RADS CATEGORY  1: Negative.    Pt has not had pap in 3 or more years. States hx of abnormal but on work up and repeats were negative. Pt has appointment scheduled with gyn today but she missed appointment today. She will be rescheduled for December now.  Pt had screening phq-9 score done. Pt score is 16. Describes work relates stress and some stress with her mom. In 2021 I had rx'd lexapro 10 mg daily. She thought it did help. She did not follow up and never go refill.    Declines both flu and covid vaccine.   Review of Systems  Constitutional:  Negative for chills, fatigue and fever.  HENT:  Negative for congestion and ear discharge.   Respiratory:  Negative for cough, chest tightness, shortness of breath and wheezing.   Cardiovascular:  Negative for chest pain and palpitations.  Gastrointestinal:  Negative for abdominal pain, constipation, nausea and vomiting.  Genitourinary:  Negative for difficulty urinating, dysuria, enuresis, flank pain and hematuria.  Musculoskeletal:  Negative for back pain and myalgias.  Skin:  Negative for rash.  Neurological:  Negative for dizziness, seizures and headaches.  Psychiatric/Behavioral:  Negative for behavioral problems and confusion.       Objective:   Physical Exam  General Mental  Status- Alert. General Appearance- Not in acute distress.   Skin Scattered freckles on arms and back. Moderate sized moles on back.  Neck Carotid Arteries- Normal color. Moisture- Normal Moisture. No carotid bruits. No JVD.  Chest and Lung Exam Auscultation: Breath Sounds:-Normal.  Cardiovascular Auscultation:Rythm- Regular. Murmurs & Other Heart Sounds:Auscultation of the heart reveals- No Murmurs.  Abdomen Inspection:-Inspeection Normal. Palpation/Percussion:Note:No mass. Palpation and Percussion of the abdomen reveal- Non Tender, Non Distended + BS, no rebound or guarding.    Neurologic Cranial Nerve exam:- CN III-XII intact(No nystagmus), symmetric smile. Drift Test:- No drift. Romberg Exam:- Negative.  Heal to Toe Gait exam:-Normal. Finger to Nose:- Normal/Intact Strength:- 5/5 equal and symmetric strength both upper and lower extremities.       Assessment & Plan:   Patient Instructions  For you wellness exam today I have ordered cbc, cmp, hiv, hep c antibody and lipid panel.  Vaccines declined. Flu, covid and shingles vaccine.  Recommend exercise and healthy diet.  We will let you know lab results as they come in.  Follow up date one month to see how responded to lexapro. Sooner if needed.  Refer to colonoscopy vs cologuard.  Keep appointment with gyn for pap.  For depression rx lexapro 10 mg daily.   Mackie Pai, [place]   [id number] as did address depression. Rx lexapro 10 mg daily.

## 2020-12-01 ENCOUNTER — Telehealth: Payer: Self-pay | Admitting: Medical

## 2020-12-01 LAB — HIV ANTIBODY (ROUTINE TESTING W REFLEX): HIV 1&2 Ab, 4th Generation: NONREACTIVE

## 2020-12-01 LAB — HEPATITIS C ANTIBODY
Hepatitis C Ab: NONREACTIVE
SIGNAL TO CUT-OFF: 0.09 (ref <1.00)

## 2020-12-01 MED ORDER — ESCITALOPRAM OXALATE 10 MG PO TABS: 10.0000 mg | ORAL_TABLET | Freq: Every day | ORAL | 11 refills | Status: DC

## 2020-12-01 NOTE — Addendum Note (Signed)
Addended by: Anabel Halon on: 12/01/2020 01:05 PM   Modules accepted: Orders

## 2020-12-01 NOTE — Telephone Encounter (Signed)
Pt.notified

## 2020-12-01 NOTE — Telephone Encounter (Signed)
Pt. Thought medication was requested to be refilled after her appointment yesterday.  Medication:  escitalopram (LEXAPRO) 10 MG tablet Has the patient contacted their pharmacy? Yes.   (If no, request that the patient contact the pharmacy for the refill.) (If yes, when and what did the pharmacy advise?)  Preferred Pharmacy (with phone number or street name): [place], [place] [address]  Phone:  [phone]    Agent: Please be advised that RX refills may take up to 3 business days. We ask that you follow-up with your pharmacy.

## 2020-12-02 MED ORDER — ATORVASTATIN CALCIUM 10 MG PO TABS: 10.0000 mg | ORAL_TABLET | Freq: Every day | ORAL | 3 refills | Status: DC

## 2020-12-02 NOTE — Addendum Note (Signed)
Addended by: Anabel Halon on: 12/02/2020 07:48 PM   Modules accepted: Orders

## 2020-12-31 ENCOUNTER — Other Ambulatory Visit: Payer: Self-pay

## 2020-12-31 ENCOUNTER — Ambulatory Visit (INDEPENDENT_AMBULATORY_CARE_PROVIDER_SITE_OTHER): Payer: 59 | Admitting: Medical

## 2020-12-31 VITALS — BP 123/55 | HR 60 | Temp 98.4°F | Resp 18 | Ht 63.0 in | Wt 209.0 lb

## 2020-12-31 DIAGNOSIS — E785 Hyperlipidemia, unspecified: Secondary | ICD-10-CM

## 2020-12-31 DIAGNOSIS — F419 Anxiety disorder, unspecified: Secondary | ICD-10-CM | POA: Diagnosis not present

## 2020-12-31 DIAGNOSIS — F32A Depression, unspecified: Secondary | ICD-10-CM

## 2020-12-31 DIAGNOSIS — Z1211 Encounter for screening for malignant neoplasm of colon: Secondary | ICD-10-CM

## 2020-12-31 NOTE — Progress Notes (Signed)
Subjective:    Patient ID: Miranda Cunningham, female    DOB: 26-Oct-1955, 65 y.o.   MRN: [id number]  HPI  Pt in for follow up for depression. Pt states her mood feels better since starting the lexapro. She states still very busy at work. Pt had stress related to mom health. Mother is doing better with health.  Pt also has anxiety and reports this is less as well.  On wellness labs her cholesterol was elevated. I had rx'd atorvastatin. Pt is currently on with no reported side effects.  Review of Systems  Constitutional:  Negative for chills, fatigue and fever.  Respiratory:  Negative for cough, chest tightness, shortness of breath and wheezing.   Cardiovascular:  Negative for chest pain and palpitations.  Gastrointestinal:  Negative for abdominal pain and constipation.  Genitourinary:  Negative for dysuria.  Musculoskeletal:  Negative for back pain, joint swelling, myalgias and neck stiffness.  Skin:  Negative for rash.  Neurological:  Negative for dizziness, seizures, speech difficulty, weakness and headaches.  Hematological:  Negative for adenopathy. Does not bruise/bleed easily.  Psychiatric/Behavioral:  Negative for decreased concentration, dysphoric mood and hallucinations. The patient is not nervous/anxious.     No past medical history on file.   Social History   Socioeconomic History   Marital status: Divorced    Spouse name: Not on file   Number of children: Not on file   Years of education: Not on file   Highest education level: Not on file  Occupational History   Not on file  Tobacco Use   Smoking status: Never   Smokeless tobacco: Never  Vaping Use   Vaping Use: Never used  Substance and Sexual Activity   Alcohol use: Not Currently   Drug use: Not on file   Sexual activity: Not on file  Other Topics Concern   Not on file  Social History Narrative   Not on file   Social Determinants of Health   Financial Resource Strain: Not on file  Food Insecurity: Not on  file  Transportation Needs: Not on file  Physical Activity: Not on file  Stress: Not on file  Social Connections: Not on file  Intimate Partner Violence: Not on file    Past Surgical History:  Procedure Laterality Date   BREAST BIOPSY     BREAST EXCISIONAL BIOPSY Right     No family history on file.  No Known Allergies  Current Outpatient Medications on File Prior to Visit  Medication Sig Dispense Refill   atorvastatin (LIPITOR) 10 MG tablet Take 1 tablet (10 mg total) by mouth daily. 30 tablet 3   escitalopram (LEXAPRO) 10 MG tablet Take one tab PO daily. 15 tablet 1   escitalopram (LEXAPRO) 10 MG tablet Take 1 tablet (10 mg total) by mouth at bedtime. 30 tablet 11   nystatin cream (MYCOSTATIN) Apply to affected area of inframammary crease 2 times daily x 10 days, as needed 30 g 0   No current facility-administered medications on file prior to visit.    BP (!) 123/55   Pulse 60   Temp 98.4 F (36.9 C)   Resp 18   Ht 5\' 3"  (1.6 m)   Wt 209 lb (94.8 kg)   SpO2 97%   BMI 37.02 kg/m       Objective:   Physical Exam  General Mental Status- Alert. General Appearance- Not in acute distress.   Skin General: Color- Normal Color. Moisture- Normal Moisture.  Neck Carotid Arteries- Normal color.  Moisture- Normal Moisture. No carotid bruits. No JVD.  Chest and Lung Exam Auscultation: Breath Sounds:-Normal.  Cardiovascular Auscultation:Rythm- Regular. Murmurs & Other Heart Sounds:Auscultation of the heart reveals- No Murmurs.  Abdomen Inspection:-Inspeection Normal. Palpation/Percussion:Note:No mass. Palpation and Percussion of the abdomen reveal- Non Tender, Non Distended + BS, no rebound or guarding.   Neurologic Cranial Nerve exam:- CN III-XII intact(No nystagmus), symmetric smile. Strength:- 5/5 equal and symmetric strength both upper and lower extremities.       Assessment & Plan:   Patient Instructions  Depression and anxiety improved/she is  satisfied with mood. Stay on current dose lexapro.  Hyperlipidemia. Advised continue to eat healthier and stay on atorvastatin. Placing future labs to do fasting in 2 months. Please get scheduled.   Follow up with me in 4-6 months.   Mackie Pai, PA-C

## 2020-12-31 NOTE — Patient Instructions (Addendum)
Depression and anxiety improved/she is satisfied with mood. Stay on current dose lexapro.  Hyperlipidemia. Advised continue to eat healthier and stay on atorvastatin. Placing future labs to do fasting in 2 months. Please get scheduled.  Saw that gi called you and no answer. They closed referral. Ask that you go upstairs to get scheduled.  Pt seeing gyn next month for pap.   Follow up with me in 4-6 months.

## 2021-01-13 NOTE — Progress Notes (Signed)
GYNECOLOGY ANNUAL PREVENTATIVE CARE ENCOUNTER NOTE  History:     Pebbles Zeiders is a 65 y.o. G1P1 female here for a routine annual gynecologic exam.  Current complaints: None.    Denies abnormal vaginal bleeding, discharge, pelvic pain, problems with intercourse or other gynecologic concerns.  She is sexually active occasionally. She denies PMB.   She is a caregiver for her mother. She works part time at [employer]. She has a boyfriend.      Gynecologic History No LMP recorded. Patient is postmenopausal.  Pap history: She has a long history of normal paps. She has never had surgery on her cervix.  Last Mammogram: 09/2020.  Result was normal Last Colonoscopy: January 2023.    Obstetric History OB History  Gravida Para Term Preterm AB Living  1 1 1     1   SAB IAB Ectopic Multiple Live Births          1    # Outcome Date GA Lbr Len/2nd Weight Sex Delivery Anes PTL Lv  1 Term 1983 [redacted]w[redacted]d   F Vag-Spont EPI N LIV    History reviewed. No pertinent past medical history.  Past Surgical History:  Procedure Laterality Date   BREAST BIOPSY     BREAST EXCISIONAL BIOPSY Right     Current Outpatient Medications on File Prior to Visit  Medication Sig Dispense Refill   atorvastatin (LIPITOR) 10 MG tablet Take 1 tablet (10 mg total) by mouth daily. 30 tablet 3   escitalopram (LEXAPRO) 10 MG tablet Take 1 tablet (10 mg total) by mouth at bedtime. 30 tablet 11   escitalopram (LEXAPRO) 10 MG tablet Take one tab PO daily. (Patient not taking: Reported on 01/19/2021) 15 tablet 1   nystatin cream (MYCOSTATIN) Apply to affected area of inframammary crease 2 times daily x 10 days, as needed (Patient not taking: Reported on 01/19/2021) 30 g 0   No current facility-administered medications on file prior to visit.    Allergies  Allergen Reactions   Gramineae Pollens Other (See Comments)    Typical symptoms of Hay Fever, including sneezing Typical symptoms of Hay Fever, including  sneezing Typical symptoms of Hay Fever, including sneezing Typical symptoms of Hay Fever, including sneezing Typical symptoms of Hay Fever, including sneezing    Methylpyrrolidone Other (See Comments)    Other reaction(s): Dizziness (intolerance) sneezing sneezing    Lac Bovis Nausea Only    Social History:  reports that she has never smoked. She has never used smokeless tobacco. She reports that she does not drink alcohol and does not use drugs.  Family History  Problem Relation Age of Onset   Jaundice Mother    Cirrhosis Mother    Macular degeneration Mother     The following portions of the patient's history were reviewed and updated as appropriate: allergies, current medications, past family history, past medical history, past social history, past surgical history and problem list.  Review of Systems Pertinent items noted in HPI and remainder of comprehensive ROS otherwise negative.  Physical Exam:  BP (!) 127/54   Pulse (!) 54   Ht 5\' 3"  (1.6 m)   Wt 205 lb (93 kg)   BMI 36.31 kg/m  CONSTITUTIONAL: Well-developed, well-nourished female in no acute distress.  HENT:  Normocephalic, atraumatic, External right and left ear normal.  EYES: Conjunctivae and EOM are normal. Pupils are equal, round, and reactive to light. No scleral icterus.  NECK: Normal range of motion, supple, no masses.  Normal thyroid.  SKIN: Skin is warm and dry. No rash noted. Not diaphoretic. No erythema. No pallor. MUSCULOSKELETAL: Normal range of motion. No tenderness.  No cyanosis, clubbing, or edema. NEUROLOGIC: Alert and oriented to person, place, and time. Normal reflexes, muscle tone coordination.  PSYCHIATRIC: Normal mood and affect. Normal behavior. Normal judgment and thought content.  CARDIOVASCULAR: Normal heart rate noted, regular rhythm RESPIRATORY: Clear to auscultation bilaterally. Effort and breath sounds normal, no problems with respiration noted.  BREASTS: Symmetric in size. No  masses, tenderness, skin changes, nipple drainage, or lymphadenopathy bilaterally. Performed in the presence of a chaperone. ABDOMEN: Soft, no distention noted.  No tenderness, rebound or guarding.  PELVIC: External genitalia normal, Vagina normal without discharge, Urethra without abnormality or discharge, no bladder tenderness, cervix normal in appearance, no CMT, uterus normal size, shape, and consistency, no adnexal masses or tenderness. Performed in the presence of a chaperone.  Assessment and Plan:    1. Encounter for annual routine gynecological examination - Cervical cancer screening: Discussed guidelines. Pap with HPV done today - Breast Health: Encouraged self breast awareness/SBE. Discussed limits of clinical breast exam for detecting breast cancer. Discussed importance of annual MXR.  MXR up to date and normal in August - Climacteric/Sexual health: Reviewed typical and atypical symptoms of menopause/peri-menopause. Discussed PMB and to call if any amount of spotting.  - Bone Health: Calcium via diet and supplementation. Discussed weight bearing exercise. DEXA normal at chiropracters per pt - Colonoscopy:  Scheduled for January 2023 - F/U 12 months and prn   Routine preventative health maintenance measures emphasized. Please refer to After Visit Summary for other counseling recommendations.       Radene Gunning, MD, [place] for [facility], [place]

## 2021-01-19 ENCOUNTER — Other Ambulatory Visit: Payer: Self-pay

## 2021-01-19 ENCOUNTER — Telehealth: Payer: Self-pay | Admitting: General Practice

## 2021-01-19 ENCOUNTER — Encounter: Payer: Self-pay | Admitting: Obstetrics and Gynecology

## 2021-01-19 ENCOUNTER — Other Ambulatory Visit (HOSPITAL_COMMUNITY)
Admission: RE | Admit: 2021-01-19 | Discharge: 2021-01-19 | Disposition: A | Discharge status: Home / Self Care | Payer: 59 | Source: Ambulatory Visit | Attending: Obstetrics and Gynecology | Admitting: Obstetrics and Gynecology

## 2021-01-19 ENCOUNTER — Ambulatory Visit (INDEPENDENT_AMBULATORY_CARE_PROVIDER_SITE_OTHER): Payer: 59 | Admitting: Obstetrics and Gynecology

## 2021-01-19 VITALS — BP 127/54 | HR 54 | Ht 63.0 in | Wt 205.0 lb

## 2021-01-19 DIAGNOSIS — Z01419 Encounter for gynecological examination (general) (routine) without abnormal findings: Secondary | ICD-10-CM | POA: Diagnosis not present

## 2021-01-19 NOTE — Telephone Encounter (Signed)
Patient aware of insurance [facility]) will not be in network with [place] effective 02/13/2021.  Information given to patient of when open enrollment will be and choosing a new plan.  Pt verbalized understanding.

## 2021-01-21 LAB — CYTOLOGY - PAP
Comment: NEGATIVE
Diagnosis: NEGATIVE
High risk HPV: NEGATIVE

## 2021-02-22 ENCOUNTER — Ambulatory Visit (AMBULATORY_SURGERY_CENTER): Payer: 59 | Admitting: *Deleted

## 2021-02-22 ENCOUNTER — Other Ambulatory Visit: Payer: Self-pay

## 2021-02-22 VITALS — Ht 63.0 in | Wt 190.0 lb

## 2021-02-22 DIAGNOSIS — Z1211 Encounter for screening for malignant neoplasm of colon: Secondary | ICD-10-CM

## 2021-02-22 MED ORDER — CLENPIQ 10-3.5-12 MG-GM -GM/160ML PO SOLN: 1.0000 | ORAL | 0 refills | Status: DC

## 2021-02-22 NOTE — Progress Notes (Signed)
Patient's pre-visit was done today over the phone with the patient. Name,DOB and address verified. Patient denies any allergies to Eggs and Soy. Patient denies any problems with anesthesia/sedation. Patient is not taking any diet pills or blood thinners. No home Oxygen.  Prep instructions sent to pt's MyChart-pt aware. Patient understands to call us back with any questions or concerns. Patient is aware of our care-partner policy and OVFIE-33 safety protocol. Patient does not have new [place] insurance card-she will call us with that information when she gets it in the mail. Pt will print a clenpiq coupon from website to take to pharmacy.  EMMI education assigned to the patient for the procedure, sent to [place].

## 2021-03-02 ENCOUNTER — Other Ambulatory Visit (INDEPENDENT_AMBULATORY_CARE_PROVIDER_SITE_OTHER): Payer: Managed Care, Other (non HMO)

## 2021-03-02 DIAGNOSIS — E785 Hyperlipidemia, unspecified: Secondary | ICD-10-CM

## 2021-03-02 LAB — COMPREHENSIVE METABOLIC PANEL
ALT: 12 U/L (ref 0–35)
AST: 14 U/L (ref 0–37)
Albumin: 3.9 g/dL (ref 3.5–5.2)
Alkaline Phosphatase: 69 U/L (ref 39–117)
BUN: 12 mg/dL (ref 6–23)
CO2: 30 mEq/L (ref 19–32)
Calcium: 8.9 mg/dL (ref 8.4–10.5)
Chloride: 102 mEq/L (ref 96–112)
Creatinine, Ser: 0.68 mg/dL (ref 0.40–1.20)
GFR: 91.8 mL/min (ref >60.00)
Glucose, Bld: 96 mg/dL (ref 70–99)
Potassium: 4.4 mEq/L (ref 3.5–5.1)
Sodium: 139 mEq/L (ref 135–145)
Total Bilirubin: 0.6 mg/dL (ref 0.2–1.2)
Total Protein: 6.7 g/dL (ref 6.0–8.3)

## 2021-03-02 LAB — LIPID PANEL
Cholesterol: 218 mg/dL — ABNORMAL HIGH (ref 0–200)
HDL: 41.8 mg/dL (ref >39.00)
LDL Cholesterol: 153 mg/dL — ABNORMAL HIGH (ref 0–99)
NonHDL: 175.85
Total CHOL/HDL Ratio: 5
Triglycerides: 116 mg/dL (ref 0.0–149.0)
VLDL: 23.2 mg/dL (ref 0.0–40.0)

## 2021-03-03 ENCOUNTER — Encounter: Payer: Self-pay | Admitting: Gastroenterology

## 2021-03-07 ENCOUNTER — Telehealth: Payer: Self-pay | Admitting: Gastroenterology

## 2021-03-07 NOTE — Telephone Encounter (Signed)
Good Morning Dr. Bryan Lemma,  Patient called to reschedule procedure for tomorrow at 10:30 due to having the flu.    Patient was rescheduled for 3/1 at 11:00.

## 2021-03-08 ENCOUNTER — Encounter: Payer: 59 | Admitting: Gastroenterology

## 2021-04-13 ENCOUNTER — Encounter: Payer: Self-pay | Admitting: Gastroenterology

## 2021-04-13 ENCOUNTER — Ambulatory Visit (AMBULATORY_SURGERY_CENTER): Payer: Managed Care, Other (non HMO) | Admitting: Gastroenterology

## 2021-04-13 VITALS — BP 152/94 | HR 72 | Temp 98.4°F | Resp 16 | Ht 63.0 in | Wt 190.0 lb

## 2021-04-13 DIAGNOSIS — Z1211 Encounter for screening for malignant neoplasm of colon: Secondary | ICD-10-CM | POA: Diagnosis present

## 2021-04-13 DIAGNOSIS — K641 Second degree hemorrhoids: Secondary | ICD-10-CM

## 2021-04-13 DIAGNOSIS — K5730 Diverticulosis of large intestine without perforation or abscess without bleeding: Secondary | ICD-10-CM

## 2021-04-13 DIAGNOSIS — D125 Benign neoplasm of sigmoid colon: Secondary | ICD-10-CM

## 2021-04-13 DIAGNOSIS — D120 Benign neoplasm of cecum: Secondary | ICD-10-CM | POA: Diagnosis not present

## 2021-04-13 MED ORDER — SODIUM CHLORIDE 0.9 % IV SOLN: 500.0000 mL | Freq: Once | INTRAVENOUS | Status: DC

## 2021-04-13 NOTE — Patient Instructions (Signed)
Thank you for allowing [place] to care for you today. ?Await final results, approximately 2 weeks. ?Resume previous diet and medications today.  ? Return to normal daily activities tomorrow. ?Recommend using fiber daily, such as Citrucel, Fibercon, Metamucil, or Konsyl.  This will benefit your hemorrhoids and diverticulosis.  Drink plenty of fluids when taking fiber, 64 ounces/day. ? ? ? ?YOU HAD AN ENDOSCOPIC PROCEDURE TODAY AT [facility]:   Refer to the procedure report that was given to you for any specific questions about what was found during the examination.  If the procedure report does not answer your questions, please call your gastroenterologist to clarify.  If you requested that your care partner not be given the details of your procedure findings, then the procedure report has been included in a sealed envelope for you to review at your convenience later. ? ?YOU SHOULD EXPECT: Some feelings of bloating in the abdomen. Passage of more gas than usual.  Walking can help get rid of the air that was put into your GI tract during the procedure and reduce the bloating. If you had a lower endoscopy (such as a colonoscopy or flexible sigmoidoscopy) you may notice spotting of blood in your stool or on the toilet paper. If you underwent a bowel prep for your procedure, you may not have a normal bowel movement for a few days. ? ?Please Note:  You might notice some irritation and congestion in your nose or some drainage.  This is from the oxygen used during your procedure.  There is no need for concern and it should clear up in a day or so. ? ?SYMPTOMS TO REPORT IMMEDIATELY: ? ?Following lower endoscopy (colonoscopy or flexible sigmoidoscopy): ? Excessive amounts of blood in the stool ? Significant tenderness or worsening of abdominal pains ? Swelling of the abdomen that is new, acute ? Fever of 100?F or higher ? ? ?For urgent or emergent issues, a gastroenterologist can be reached at any hour by calling  [phone]. ?Do not use MyChart messaging for urgent concerns.  ? ? ?DIET:  We do recommend a small meal at first, but then you may proceed to your regular diet.  Drink plenty of fluids but you should avoid alcoholic beverages for 24 hours. ? ?ACTIVITY:  You should plan to take it easy for the rest of today and you should NOT DRIVE or use heavy machinery until tomorrow (because of the sedation medicines used during the test).   ? ?FOLLOW UP: ?Our staff will call the number listed on your records 48-72 hours following your procedure to check on you and address any questions or concerns that you may have regarding the information given to you following your procedure. If we do not reach you, we will leave a message.  We will attempt to reach you two times.  During this call, we will ask if you have developed any symptoms of COVID 19. If you develop any symptoms (ie: fever, flu-like symptoms, shortness of breath, cough etc.) before then, please call [phone].  If you test positive for Covid 19 in the 2 weeks post procedure, please call and report this information to [place].   ? ?If any biopsies were taken you will be contacted by phone or by letter within the next 1-3 weeks.  Please call us at [phone] if you have not heard about the biopsies in 3 weeks.  ? ? ?SIGNATURES/CONFIDENTIALITY: ?You and/or your care partner have signed paperwork which will be entered into  your electronic medical record.  These signatures attest to the fact that that the information above on your After Visit Summary has been reviewed and is understood.  Full responsibility of the confidentiality of this discharge information lies with you and/or your care-partner.  ?

## 2021-04-13 NOTE — Progress Notes (Signed)
Sedate, gd SR, tolerated procedure well, VSS, report to RN 

## 2021-04-13 NOTE — Progress Notes (Signed)
? ?GASTROENTEROLOGY PROCEDURE H&P NOTE  ? ?Primary Care Physician: ?Mackie Pai, PA-C ? ? ? ?Reason for Procedure:  Colon Cancer screening ? ?Plan:    Colonoscopy ? ?Patient is appropriate for endoscopic procedure(s) in the ambulatory ([place]) setting. ? ?The nature of the procedure, as well as the risks, benefits, and alternatives were carefully and thoroughly reviewed with the patient. Ample time for discussion and questions allowed. The patient understood, was satisfied, and agreed to proceed.  ? ? ? ?HPI: ?Miranda Cunningham is a 66 y.o. female who presents for colonoscopy for routine Colon Cancer screening.  No active GI symptoms.   ? ?Past Medical History:  ?Diagnosis Date  ? Allergy   ? Hyperlipidemia   ? ? ?Past Surgical History:  ?Procedure Laterality Date  ? BREAST BIOPSY    ? BREAST EXCISIONAL BIOPSY Right   ? CARPAL TUNNEL RELEASE    ? LAPAROSCOPY    ? SHOULDER ARTHROSCOPY    ? ? ?Prior to Admission medications   ?Medication Sig Start Date End Date Taking? Authorizing Provider  ?atorvastatin (LIPITOR) 10 MG tablet Take 1 tablet (10 mg total) by mouth daily. 12/02/20  Yes Saguier, Percell Miller, PA-C  ?escitalopram (LEXAPRO) 10 MG tablet Take 1 tablet (10 mg total) by mouth at bedtime. 12/01/20  Yes Saguier, Percell Miller, PA-C  ?Loratadine (CLARITIN PO) Take by mouth.   Yes [provider]  ?Iodine Strong, Lugols, (IODINE STRONG PO) Take by mouth.    [provider]  ?Multiple Vitamins-Minerals (EYE VITAMINS PO) Take by mouth.    [provider]  ? ? ?Current Outpatient Medications  ?Medication Sig Dispense Refill  ? atorvastatin (LIPITOR) 10 MG tablet Take 1 tablet (10 mg total) by mouth daily. 30 tablet 3  ? escitalopram (LEXAPRO) 10 MG tablet Take 1 tablet (10 mg total) by mouth at bedtime. 30 tablet 11  ? Loratadine (CLARITIN PO) Take by mouth.    ? Iodine Strong, Lugols, (IODINE STRONG PO) Take by mouth.    ? Multiple Vitamins-Minerals (EYE VITAMINS PO) Take by mouth.    ? ?Current  Facility-Administered Medications  ?Medication Dose Route Frequency Provider Last Rate Last Admin  ? 0.9 %  sodium chloride infusion  500 mL Intravenous Once Amanat Hackel V, DO      ? ? ?Allergies as of 04/13/2021 - Review Complete 04/13/2021  ?Allergen Reaction Noted  ? Gramineae pollens Other (See Comments) 01/20/2015  ? Methylpyrrolidone Other (See Comments) 01/29/2015  ? Lac bovis Nausea Only 01/29/2015  ? ? ?Family History  ?Problem Relation Age of Onset  ? Jaundice Mother   ? Cirrhosis Mother   ? Macular degeneration Mother   ? Colon cancer Neg Hx   ? Colon polyps Neg Hx   ? Stomach cancer Neg Hx   ? Rectal cancer Neg Hx   ? ? ?Social History  ? ?Socioeconomic History  ? Marital status: Divorced  ?  Spouse name: Not on file  ? Number of children: Not on file  ? Years of education: Not on file  ? Highest education level: Not on file  ?Occupational History  ? Not on file  ?Tobacco Use  ? Smoking status: Never  ? Smokeless tobacco: Never  ?Vaping Use  ? Vaping Use: Never used  ?Substance and Sexual Activity  ? Alcohol use: Never  ? Drug use: Never  ? Sexual activity: Not Currently  ?Other Topics Concern  ? Not on file  ?Social History Narrative  ? Not on file  ? ?Social  Determinants of Health  ? ?Financial Resource Strain: Not on file  ?Food Insecurity: Not on file  ?Transportation Needs: Not on file  ?Physical Activity: Not on file  ?Stress: Not on file  ?Social Connections: Not on file  ?Intimate Partner Violence: Not on file  ? ? ?Physical Exam: ?Vital signs in last 24 hours: ?@BP  140/78   Pulse 64   Temp 98.4 ?F (36.9 ?C)   Ht 5\' 3"  (1.6 m)   Wt 190 lb (86.2 kg)   SpO2 97%   BMI 33.66 kg/m?  ?GEN: NAD ?EYE: Sclerae anicteric ?ENT: MMM ?CV: Non-tachycardic ?Pulm: CTA b/l ?GI: Soft, NT/ND ?NEURO:  Alert & Oriented x 3 ? ? ?Gerrit Heck, DO ?[facility] ? ? ?04/13/2021 10:22 AM ? ?

## 2021-04-13 NOTE — Op Note (Signed)
[place] ?Patient Name: Miranda Cunningham ?Procedure Date: 04/13/2021 10:58 AM ?MRN: [id number] ?Endoscopist: Gerrit Heck , MD ?Age: 66 ?Referring MD:  ?Date of Birth: 09-01-1955 ?Gender: Female ?Account #: [id number] ?Procedure:                Colonoscopy ?Indications:              Screening for colorectal malignant neoplasm, This  ?                          is the patient's first colonoscopy ?Medicines:                Monitored Anesthesia Care ?Procedure:                Pre-Anesthesia Assessment: ?                          - Prior to the procedure, a History and Physical  ?                          was performed, and patient medications and  ?                          allergies were reviewed. The patient's tolerance of  ?                          previous anesthesia was also reviewed. The risks  ?                          and benefits of the procedure and the sedation  ?                          options and risks were discussed with the patient.  ?                          All questions were answered, and informed consent  ?                          was obtained. Prior Anticoagulants: The patient has  ?                          taken no previous anticoagulant or antiplatelet  ?                          agents. ASA Grade Assessment: II - A patient with  ?                          mild systemic disease. After reviewing the risks  ?                          and benefits, the patient was deemed in  ?                          satisfactory condition to undergo the procedure. ?  After obtaining informed consent, the colonoscope  ?                          was passed under direct vision. Throughout the  ?                          procedure, the patient's blood pressure, pulse, and  ?                          oxygen saturations were monitored continuously. The  ?                          Olympus CF-HQ190L ([id number]) Colonoscope was  ?                          introduced through the anus  and advanced to the the  ?                          terminal ileum. The colonoscopy was performed  ?                          without difficulty. The patient tolerated the  ?                          procedure well. The quality of the bowel  ?                          preparation was good. The terminal ileum, ileocecal  ?                          valve, appendiceal orifice, and rectum were  ?                          photographed. ?Scope In: 11:03:07 AM ?Scope Out: 11:20:17 AM ?Scope Withdrawal Time: 0 hours 14 minutes 38 seconds  ?Total Procedure Duration: 0 hours 17 minutes 10 seconds  ?Findings:                 The perianal and digital rectal examinations were  ?                          normal. ?                          A 3 mm polyp was found in the cecum. The polyp was  ?                          sessile. The polyp was removed with a cold snare.  ?                          Resection and retrieval were complete. Estimated  ?                          blood loss was minimal. ?  A 5 mm polyp was found in the sigmoid colon. The  ?                          polyp was sessile. The polyp was removed with a  ?                          cold snare. Resection and retrieval were complete.  ?                          Estimated blood loss was minimal. ?                          A single medium-sized, non-bleeding angioectasia  ?                          with typical arborization was found in the cecum. ?                          Multiple small and large-mouthed diverticula were  ?                          found in the sigmoid colon. ?                          Non-bleeding internal hemorrhoids were found during  ?                          retroflexion. The hemorrhoids were medium-sized. ?                          The terminal ileum appeared normal. ?Complications:            No immediate complications. ?Estimated Blood Loss:     Estimated blood loss was minimal. ?Impression:               - One 3 mm  polyp in the cecum, removed with a cold  ?                          snare. Resected and retrieved. ?                          - One 5 mm polyp in the sigmoid colon, removed with  ?                          a cold snare. Resected and retrieved. ?                          - A single colonic angioectasia. ?                          - Diverticulosis in the sigmoid colon. ?                          - Non-bleeding internal hemorrhoids. ?                          -  The examined portion of the ileum was normal. ?Recommendation:           - Patient has a contact number available for  ?                          emergencies. The signs and symptoms of potential  ?                          delayed complications were discussed with the  ?                          patient. Return to normal activities tomorrow.  ?                          Written discharge instructions were provided to the  ?                          patient. ?                          - Resume previous diet. ?                          - Continue present medications. ?                          - Await pathology results. ?                          - Repeat colonoscopy for surveillance based on  ?                          pathology results. ?                          - Return to GI office PRN. ?                          - Use fiber, for example Citrucel, Fibercon, Konsyl  ?                          or Metamucil. ?                          - Internal hemorrhoids were noted on this study and  ?                          may be amenable to hemorrhoid band ligation. If you  ?                          are interested in further treatment of these  ?                          hemorrhoids with band ligation, please contact my  ?  clinic to set up an appointment for evaluation and  ?                          treatment. ?Gerrit Heck, MD ?04/13/2021 11:25:34 AM ?

## 2021-04-13 NOTE — Progress Notes (Signed)
Pt's states no medical or surgical changes since previsit or office visit.   VS taken by CW 

## 2021-04-13 NOTE — Progress Notes (Signed)
Called to room to assist during endoscopic procedure.  Patient ID and intended procedure confirmed with present staff. Received instructions for my participation in the procedure from the performing physician.  

## 2021-04-15 ENCOUNTER — Telehealth: Payer: Self-pay

## 2021-04-15 NOTE — Telephone Encounter (Signed)
?  Follow up Call- ? ?Call back number 04/13/2021  ?Post procedure Call Back phone  # [phone]  ?Permission to leave phone message Yes  ?Some recent data might be hidden  ?  ? ?Patient questions: ? ?Do you have a fever, pain , or abdominal swelling? No. ?Pain Score  0 * ? ?Have you tolerated food without any problems? Yes.   ? ?Have you been able to return to your normal activities? Yes.   ? ?Do you have any questions about your discharge instructions: ?Diet   No. ?Medications  No. ?Follow up visit  No. ? ?Do you have questions or concerns about your Care? No. ? ?Actions: ?* If pain score is 4 or above: ?No action needed, pain <4. ? ?Have you developed a fever since your procedure? no ? ?2.   Have you had an respiratory symptoms (SOB or cough) since your procedure? no ? ?3.   Have you tested positive for COVID 19 since your procedure no ? ?4.   Have you had any family members/close contacts diagnosed with the COVID 19 since your procedure?  no ? ? ?If yes to any of these questions please route to Joylene John, RN and Joella Prince, RN  ? ? ?

## 2021-04-20 ENCOUNTER — Encounter: Payer: Self-pay | Admitting: Gastroenterology

## 2021-04-26 ENCOUNTER — Other Ambulatory Visit: Payer: Self-pay | Admitting: Medical

## 2021-05-31 ENCOUNTER — Ambulatory Visit (INDEPENDENT_AMBULATORY_CARE_PROVIDER_SITE_OTHER): Payer: Managed Care, Other (non HMO) | Admitting: Medical

## 2021-05-31 ENCOUNTER — Encounter: Payer: Self-pay | Admitting: Medical

## 2021-05-31 VITALS — BP 130/78 | HR 65 | Temp 97.9°F | Resp 18 | Ht 63.0 in | Wt 207.0 lb

## 2021-05-31 DIAGNOSIS — R5383 Other fatigue: Secondary | ICD-10-CM

## 2021-05-31 DIAGNOSIS — D649 Anemia, unspecified: Secondary | ICD-10-CM

## 2021-05-31 DIAGNOSIS — E785 Hyperlipidemia, unspecified: Secondary | ICD-10-CM | POA: Diagnosis not present

## 2021-05-31 DIAGNOSIS — F32A Depression, unspecified: Secondary | ICD-10-CM | POA: Diagnosis not present

## 2021-05-31 DIAGNOSIS — F419 Anxiety disorder, unspecified: Secondary | ICD-10-CM

## 2021-05-31 LAB — CBC WITH DIFFERENTIAL/PLATELET
Basophils Absolute: 0 10*3/uL (ref 0.0–0.1)
Basophils Relative: 0.5 % (ref 0.0–3.0)
Eosinophils Absolute: 0.1 10*3/uL (ref 0.0–0.7)
Eosinophils Relative: 2.1 % (ref 0.0–5.0)
HCT: 35.6 % — ABNORMAL LOW (ref 36.0–46.0)
Hemoglobin: 11.8 g/dL — ABNORMAL LOW (ref 12.0–15.0)
Lymphocytes Relative: 22.8 % (ref 12.0–46.0)
Lymphs Abs: 1.3 10*3/uL (ref 0.7–4.0)
MCHC: 33.1 g/dL (ref 30.0–36.0)
MCV: 93.1 fl (ref 78.0–100.0)
Monocytes Absolute: 0.4 10*3/uL (ref 0.1–1.0)
Monocytes Relative: 7.4 % (ref 3.0–12.0)
Neutro Abs: 3.8 10*3/uL (ref 1.4–7.7)
Neutrophils Relative %: 67.2 % (ref 43.0–77.0)
Platelets: 246 10*3/uL (ref 150.0–400.0)
RBC: 3.82 Mil/uL — ABNORMAL LOW (ref 3.87–5.11)
RDW: 14.3 % (ref 11.5–15.5)
WBC: 5.7 10*3/uL (ref 4.0–10.5)

## 2021-05-31 LAB — T4, FREE: Free T4: 0.73 ng/dL (ref 0.60–1.60)

## 2021-05-31 LAB — VITAMIN B12: Vitamin B-12: 302 pg/mL (ref 211–911)

## 2021-05-31 LAB — TSH: TSH: 1.09 u[IU]/mL (ref 0.35–5.50)

## 2021-05-31 NOTE — Addendum Note (Signed)
Addended by: Anabel Halon on: 05/31/2021 08:04 PM ? ? Modules accepted: Orders ? ?

## 2021-05-31 NOTE — Progress Notes (Signed)
? ?Subjective:  ? ? Patient ID: Miranda Cunningham, female    DOB: [birth year], 66 y.o.   MRN: [id number] ? ?HPI ? ?Pt states depression and anxiety controlled but recent more stress related to work and more responsibility. Pt currently on lexapro10 mg daily.  ? ? ?Pt also has high cholesterol. Pt on atorvastatin 10 mg daily. Pt is fasting today. Will cmp and lipid panel.  ? ? ?Pt has had colonoscopy on review. Polyp found and told to repeat study in 7 years. ? ? ?Recently feeling more tired/fatigued. ? ?Review of Systems  ?Constitutional:  Positive for fatigue. Negative for chills and fever.  ?HENT:  Negative for congestion and drooling.   ?Respiratory:  Negative for cough, chest tightness, shortness of breath and wheezing.   ?Cardiovascular:  Negative for chest pain and palpitations.  ?Gastrointestinal:  Negative for abdominal pain, anal bleeding, blood in stool and constipation.  ?Genitourinary:  Negative for dysuria and frequency.  ?Musculoskeletal:  Negative for back pain, joint swelling, myalgias and neck stiffness.  ?Skin:  Negative for rash.  ?Neurological:  Negative for dizziness, seizures, numbness and headaches.  ?Hematological:  Negative for adenopathy. Does not bruise/bleed easily.  ?Psychiatric/Behavioral:  Positive for dysphoric mood. Negative for behavioral problems, confusion and suicidal ideas. The patient is nervous/anxious.   ? ?Past Medical History:  ?Diagnosis Date  ? Allergy   ? Hyperlipidemia   ? ?  ?Social History  ? ?Socioeconomic History  ? Marital status: Divorced  ?  Spouse name: Not on file  ? Number of children: Not on file  ? Years of education: Not on file  ? Highest education level: Not on file  ?Occupational History  ? Not on file  ?Tobacco Use  ? Smoking status: Never  ? Smokeless tobacco: Never  ?Vaping Use  ? Vaping Use: Never used  ?Substance and Sexual Activity  ? Alcohol use: Never  ? Drug use: Never  ? Sexual activity: Not Currently  ?Other Topics Concern  ? Not on file  ?Social  History Narrative  ? Not on file  ? ?Social Determinants of Health  ? ?Financial Resource Strain: Not on file  ?Food Insecurity: Not on file  ?Transportation Needs: Not on file  ?Physical Activity: Not on file  ?Stress: Not on file  ?Social Connections: Not on file  ?Intimate Partner Violence: Not on file  ? ? ?Past Surgical History:  ?Procedure Laterality Date  ? BREAST BIOPSY    ? BREAST EXCISIONAL BIOPSY Right   ? CARPAL TUNNEL RELEASE    ? LAPAROSCOPY    ? SHOULDER ARTHROSCOPY    ? ? ?Family History  ?Problem Relation Age of Onset  ? Jaundice Mother   ? Cirrhosis Mother   ? Macular degeneration Mother   ? Colon cancer Neg Hx   ? Colon polyps Neg Hx   ? Stomach cancer Neg Hx   ? Rectal cancer Neg Hx   ? ? ?Allergies  ?Allergen Reactions  ? Gramineae Pollens Other (See Comments)  ?  Typical symptoms of Hay Fever, including sneezing ?Typical symptoms of Hay Fever, including sneezing ?Typical symptoms of Hay Fever, including sneezing ?Typical symptoms of Hay Fever, including sneezing ?Typical symptoms of Hay Fever, including sneezing ?  ? Methylpyrrolidone Other (See Comments)  ?  Other reaction(s): Dizziness (intolerance) ?sneezing ?sneezing ?  ? [place] Nausea Only  ? ? ?Current Outpatient Medications on File Prior to Visit  ?Medication Sig Dispense Refill  ? atorvastatin (LIPITOR) 10 MG tablet Take  1 tablet by mouth once daily 30 tablet 0  ? escitalopram (LEXAPRO) 10 MG tablet Take 1 tablet (10 mg total) by mouth at bedtime. 30 tablet 11  ? Iodine Strong, Lugols, (IODINE STRONG PO) Take by mouth.    ? Loratadine (CLARITIN PO) Take by mouth.    ? Multiple Vitamins-Minerals (EYE VITAMINS PO) Take by mouth.    ? ?No current facility-administered medications on file prior to visit.  ? ? ?BP 139/62   Pulse 65   Temp 97.9 ?F (36.6 ?C)   Resp 18   Ht '5\' 3"'$  (1.6 m)   Wt 207 lb (93.9 kg)   SpO2 96%   BMI 36.67 kg/m?  ?  ?   ?Objective:  ? Physical Exam ? ?General ?Mental Status- Alert. General Appearance- Not in  acute distress.  ? ?Skin ?General: Color- Normal Color. Moisture- Normal Moisture. ? ?Neck ?Carotid Arteries- Normal color. Moisture- Normal Moisture. No carotid bruits. No JVD. ? ?Chest and Lung Exam ?Auscultation: ?Breath Sounds:-Normal. ? ?Cardiovascular ?Auscultation:Rythm- Regular. ?Murmurs & Other Heart Sounds:Auscultation of the heart reveals- No Murmurs. ? ?Abdomen ?Inspection:-Inspeection Normal. ?Palpation/Percussion:Note:No mass. Palpation and Percussion of the abdomen reveal- Non Tender, Non Distended + BS, no rebound or guarding. ? ? ?Neurologic ?Cranial Nerve exam:- CN III-XII intact(No nystagmus), symmetric smile. ?Strength:- 5/5 equal and symmetric strength both upper and lower extremities.  ? ? ?   ?Assessment & Plan:  ? ?Patient Instructions  ?Depression and anxiety relatively well controlled. Continue lexapro 10 mg daily. ? ?For high cholesterol will get cmp and lipid panel today to assess response to 10 mg atorvastatin. ? ?Elevated bp initially but on recheck bp came down to good level. ? ?For fatigue will add b12, b1, cbc, tsh and t4. ? ?Follow up in 6 months or sooner if needed. ? ?  ?Mackie Pai, PA-C  ?

## 2021-05-31 NOTE — Patient Instructions (Addendum)
Depression and anxiety relatively well controlled. Continue lexapro 10 mg daily. ? ?For high cholesterol will get cmp and lipid panel today to assess response to 10 mg atorvastatin. ? ?Elevated bp initially but on recheck bp came down to good level. ? ?For fatigue will add b12, b1, cbc, tsh and t4. ? ?Follow up in 6 months or sooner if needed. ? ? ?

## 2021-06-06 LAB — VITAMIN B1: Vitamin B1 (Thiamine): 16 nmol/L (ref 8–30)

## 2021-11-30 ENCOUNTER — Ambulatory Visit: Payer: Managed Care, Other (non HMO) | Admitting: Medical

## 2021-12-01 ENCOUNTER — Other Ambulatory Visit (HOSPITAL_BASED_OUTPATIENT_CLINIC_OR_DEPARTMENT_OTHER): Payer: Self-pay | Admitting: Medical

## 2021-12-01 ENCOUNTER — Ambulatory Visit (INDEPENDENT_AMBULATORY_CARE_PROVIDER_SITE_OTHER): Payer: Medicare Other | Admitting: Medical

## 2021-12-01 VITALS — BP 126/74 | HR 66 | Temp 98.2°F | Resp 18 | Ht 63.0 in | Wt 209.6 lb

## 2021-12-01 DIAGNOSIS — E785 Hyperlipidemia, unspecified: Secondary | ICD-10-CM | POA: Diagnosis not present

## 2021-12-01 DIAGNOSIS — F419 Anxiety disorder, unspecified: Secondary | ICD-10-CM

## 2021-12-01 DIAGNOSIS — F32A Depression, unspecified: Secondary | ICD-10-CM | POA: Diagnosis not present

## 2021-12-01 DIAGNOSIS — Z1231 Encounter for screening mammogram for malignant neoplasm of breast: Secondary | ICD-10-CM

## 2021-12-01 DIAGNOSIS — D649 Anemia, unspecified: Secondary | ICD-10-CM | POA: Diagnosis not present

## 2021-12-01 DIAGNOSIS — R5383 Other fatigue: Secondary | ICD-10-CM | POA: Diagnosis not present

## 2021-12-01 LAB — CBC WITH DIFFERENTIAL/PLATELET
Basophils Absolute: 0 10*3/uL (ref 0.0–0.1)
Basophils Relative: 0.6 % (ref 0.0–3.0)
Eosinophils Absolute: 0.1 10*3/uL (ref 0.0–0.7)
Eosinophils Relative: 1.8 % (ref 0.0–5.0)
HCT: 36.8 % (ref 36.0–46.0)
Hemoglobin: 12.1 g/dL (ref 12.0–15.0)
Lymphocytes Relative: 23 % (ref 12.0–46.0)
Lymphs Abs: 1.5 10*3/uL (ref 0.7–4.0)
MCHC: 33 g/dL (ref 30.0–36.0)
MCV: 95 fl (ref 78.0–100.0)
Monocytes Absolute: 0.5 10*3/uL (ref 0.1–1.0)
Monocytes Relative: 7.2 % (ref 3.0–12.0)
Neutro Abs: 4.3 10*3/uL (ref 1.4–7.7)
Neutrophils Relative %: 67.4 % (ref 43.0–77.0)
Platelets: 272 10*3/uL (ref 150.0–400.0)
RBC: 3.88 Mil/uL (ref 3.87–5.11)
RDW: 13.8 % (ref 11.5–15.5)
WBC: 6.4 10*3/uL (ref 4.0–10.5)

## 2021-12-01 LAB — LIPID PANEL
Cholesterol: 222 mg/dL — ABNORMAL HIGH (ref 0–200)
HDL: 48.1 mg/dL (ref >39.00)
LDL Cholesterol: 152 mg/dL — ABNORMAL HIGH (ref 0–99)
NonHDL: 173.69
Total CHOL/HDL Ratio: 5
Triglycerides: 107 mg/dL (ref 0.0–149.0)
VLDL: 21.4 mg/dL (ref 0.0–40.0)

## 2021-12-01 LAB — COMPREHENSIVE METABOLIC PANEL
ALT: 20 U/L (ref 0–35)
AST: 16 U/L (ref 0–37)
Albumin: 4 g/dL (ref 3.5–5.2)
Alkaline Phosphatase: 56 U/L (ref 39–117)
BUN: 14 mg/dL (ref 6–23)
CO2: 29 mEq/L (ref 19–32)
Calcium: 9 mg/dL (ref 8.4–10.5)
Chloride: 106 mEq/L (ref 96–112)
Creatinine, Ser: 0.59 mg/dL (ref 0.40–1.20)
GFR: 94.49 mL/min (ref >60.00)
Glucose, Bld: 96 mg/dL (ref 70–99)
Potassium: 4.2 mEq/L (ref 3.5–5.1)
Sodium: 140 mEq/L (ref 135–145)
Total Bilirubin: 0.5 mg/dL (ref 0.2–1.2)
Total Protein: 6.3 g/dL (ref 6.0–8.3)

## 2021-12-01 MED ORDER — ATORVASTATIN CALCIUM 10 MG PO TABS: 10.0000 mg | ORAL_TABLET | Freq: Every day | ORAL | 11 refills | Status: DC

## 2021-12-01 MED ORDER — VENLAFAXINE HCL ER 37.5 MG PO CP24: 37.5000 mg | ORAL_CAPSULE | Freq: Every day | ORAL | 0 refills | Status: DC

## 2021-12-01 NOTE — Patient Instructions (Addendum)
Anxiety and depression persisting despite lexapro 10 mg daily. You have been out of med for one month. Will switch you to effexor and see if mood and energy improved.  For anemia recheck cbc.  For high cholesterol- recheck lipid panel and cmp.   Follow up date one month in office visit or virtual visit.

## 2021-12-01 NOTE — Addendum Note (Signed)
Addended by: Anabel Halon on: 12/01/2021 08:54 PM   Modules accepted: Orders

## 2021-12-01 NOTE — Progress Notes (Signed)
Subjective:    Patient ID: Miranda Cunningham, female    DOB: 1955/06/29, 66 y.o.   MRN: [id number]  HPI  Pt in for follow up.  Last visit A/P in "  "Depression and anxiety relatively well controlled. Continue lexapro 10 mg daily.   For high cholesterol will get cmp and lipid panel today to assess response to 10 mg atorvastatin.   Elevated bp initially but on recheck bp came down to good level.   For fatigue will add b12, b1, cbc, tsh and t4."   Pt sill has depression and anxiety. Phq-9 score is 15. Gad-7 score 13.   On review of labs only mild anemia. Still reports feeling fatigue. Very busy at work and taking care of elderly mom.   Pt is fasting today. Hx of high cholesterol.     Review of Systems  Constitutional:  Positive for fatigue. Negative for chills and fever.  HENT:  Negative for congestion and ear pain.   Respiratory:  Negative for chest tightness.   Cardiovascular:  Negative for chest pain and palpitations.  Gastrointestinal:  Negative for abdominal pain, blood in stool, constipation and nausea.  Genitourinary:  Negative for dyspareunia and enuresis.  Musculoskeletal:  Negative for back pain.  Skin:  Negative for rash.    Past Medical History:  Diagnosis Date   Allergy    Hyperlipidemia      Social History   Socioeconomic History   Marital status: Divorced    Spouse name: Not on file   Number of children: Not on file   Years of education: Not on file   Highest education level: Not on file  Occupational History   Not on file  Tobacco Use   Smoking status: Never   Smokeless tobacco: Never  Vaping Use   Vaping Use: Never used  Substance and Sexual Activity   Alcohol use: Never   Drug use: Never   Sexual activity: Not Currently  Other Topics Concern   Not on file  Social History Narrative   Not on file   Social Determinants of Health   Financial Resource Strain: Not on file  Food Insecurity: Not on file  Transportation Needs: Not on file   Physical Activity: Not on file  Stress: Not on file  Social Connections: Not on file  Intimate Partner Violence: Not on file    Past Surgical History:  Procedure Laterality Date   BREAST BIOPSY     BREAST EXCISIONAL BIOPSY Right    CARPAL TUNNEL RELEASE     LAPAROSCOPY     SHOULDER ARTHROSCOPY      Family History  Problem Relation Age of Onset   Jaundice Mother    Cirrhosis Mother    Macular degeneration Mother    Colon cancer Neg Hx    Colon polyps Neg Hx    Stomach cancer Neg Hx    Rectal cancer Neg Hx     Allergies  Allergen Reactions   Gramineae Pollens Other (See Comments)    Typical symptoms of Hay Fever, including sneezing Typical symptoms of Hay Fever, including sneezing Typical symptoms of Hay Fever, including sneezing Typical symptoms of Hay Fever, including sneezing Typical symptoms of Hay Fever, including sneezing    Methylpyrrolidone Other (See Comments)    Other reaction(s): Dizziness (intolerance) sneezing sneezing    Milk (Cow) Nausea Only    Current Outpatient Medications on File Prior to Visit  Medication Sig Dispense Refill   atorvastatin (LIPITOR) 10 MG tablet Take 1 tablet  by mouth once daily 30 tablet 0   escitalopram (LEXAPRO) 10 MG tablet Take 1 tablet (10 mg total) by mouth at bedtime. 30 tablet 11   Iodine Strong, Lugols, (IODINE STRONG PO) Take by mouth.     Loratadine (CLARITIN PO) Take by mouth.     Multiple Vitamins-Minerals (EYE VITAMINS PO) Take by mouth.     No current facility-administered medications on file prior to visit.    BP 126/74   Pulse 66   Temp 98.2 F (36.8 C)   Resp 18   Ht '5\' 3"'$  (1.6 m)   Wt 209 lb 9.6 oz (95.1 kg)   SpO2 98%   BMI 37.13 kg/m        Objective:   Physical Exam  General Mental Status- Alert. General Appearance- Not in acute distress.   Skin General: Color- Normal Color. Moisture- Normal Moisture.  Neck Carotid Arteries- Normal color. Moisture- Normal Moisture. No carotid  bruits. No JVD.  Chest and Lung Exam Auscultation: Breath Sounds:-Normal.  Cardiovascular Auscultation:Rythm- Regular. Murmurs & Other Heart Sounds:Auscultation of the heart reveals- No Murmurs.  Abdomen Inspection:-Inspeection Normal. Palpation/Percussion:Note:No mass. Palpation and Percussion of the abdomen reveal- Non Tender, Non Distended + BS, no rebound or guarding.   Neurologic Cranial Nerve exam:- CN III-XII intact(No nystagmus), symmetric smile. Strength:- 5/5 equal and symmetric strength both upper and lower extremities.       Assessment & Plan:   Patient Instructions  Anxiety and depression persisting despite lexapro 10 mg daily. You have been out of med for one month. Will switch you to effexor and see if mood and energy improved.  For anemia recheck cbc.  For high cholesterol- recheck lipid panel and cmp.   Follow up date one month in office visit or virtual visit.      Mackie Pai, PA-C

## 2021-12-01 NOTE — Addendum Note (Signed)
Addended by: Anabel Halon on: 12/01/2021 08:55 AM   Modules accepted: Orders

## 2021-12-07 ENCOUNTER — Ambulatory Visit (HOSPITAL_BASED_OUTPATIENT_CLINIC_OR_DEPARTMENT_OTHER)
Admission: RE | Admit: 2021-12-07 | Discharge: 2021-12-07 | Disposition: A | Discharge status: Home / Self Care | Payer: Medicare Other | Source: Ambulatory Visit | Attending: Medical | Admitting: Medical

## 2021-12-07 ENCOUNTER — Encounter (HOSPITAL_BASED_OUTPATIENT_CLINIC_OR_DEPARTMENT_OTHER): Payer: Self-pay

## 2021-12-07 DIAGNOSIS — Z1231 Encounter for screening mammogram for malignant neoplasm of breast: Secondary | ICD-10-CM | POA: Insufficient documentation

## 2021-12-27 ENCOUNTER — Encounter: Payer: Self-pay | Admitting: General Practice

## 2022-01-02 ENCOUNTER — Telehealth (INDEPENDENT_AMBULATORY_CARE_PROVIDER_SITE_OTHER): Payer: Medicare Other | Admitting: Medical

## 2022-01-02 DIAGNOSIS — F32A Depression, unspecified: Secondary | ICD-10-CM

## 2022-01-02 DIAGNOSIS — F419 Anxiety disorder, unspecified: Secondary | ICD-10-CM | POA: Diagnosis not present

## 2022-01-02 DIAGNOSIS — E785 Hyperlipidemia, unspecified: Secondary | ICD-10-CM

## 2022-01-02 MED ORDER — ESCITALOPRAM OXALATE 10 MG PO TABS: 10.0000 mg | ORAL_TABLET | Freq: Every day | ORAL | 3 refills | Status: DC

## 2022-01-02 NOTE — Progress Notes (Addendum)
   Subjective:    Patient ID: Miranda Cunningham, female    DOB: 1955/09/16, 66 y.o.   MRN: [id number]  HPI  Virtual Visit via Telephone Note  I connected with Cory Rama on 01/02/22 at  9:00 AM EST by telephone and verified that I am speaking with the correct person using two identifiers.  Location: Patient: home Provider: office   I discussed the limitations, risks, security and privacy concerns of performing an evaluation and management service by telephone and the availability of in person appointments. I also discussed with the patient that there may be a patient responsible charge related to this service. The patient expressed understanding and agreed to proceed.   History of Present Illness:   Pt had last visit for depression and anxiety. With lexapro she states both much better.  She is pleased with current dose and how she feels.  On review cholesterol level was high again. I advised rx atorvastanin and low cholesterol diet.  Observations/Objective: Gerneal- no acute distress, pleasant pt. Alert. Normal speech.  Assessment and Plan: Patient Instructions  Anxiety and depression both controlled with lexapro 10 mg daily. Sent in refills today.   High cholesterol. Refilled your atorvastatin and advise low cholesterol diet.  Follow up in 4 months or sooner if needed. Schedule early am so can come in fasting and will recheck your lipid panel.   Mackie Pai, PA-C    15 minutes spent on visit with pt today. Follow Up Instructions:    I discussed the assessment and treatment plan with the patient. The patient was provided an opportunity to ask questions and all were answered. The patient agreed with the plan and demonstrated an understanding of the instructions.   The patient was advised to call back or seek an in-person evaluation if the symptoms worsen or if the condition fails to improve as anticipated.     Mackie Pai, PA-C   Review of Systems      Objective:   Physical Exam        Assessment & Plan:

## 2022-01-02 NOTE — Patient Instructions (Signed)
Anxiety and depression both controlled with lexapro 10 mg daily. Sent in refills today.   High cholesterol. Refilled your atorvastatin and advise low cholesterol diet.  Follow up in 4 months or sooner if needed. Schedule early am so can come in fasting and will recheck your lipid panel.

## 2022-01-02 NOTE — Addendum Note (Signed)
Addended by: Anabel Halon on: 01/02/2022 02:26 PM   Modules accepted: Level of Service

## 2022-05-03 ENCOUNTER — Encounter: Payer: Self-pay | Admitting: Medical

## 2022-05-03 ENCOUNTER — Ambulatory Visit (INDEPENDENT_AMBULATORY_CARE_PROVIDER_SITE_OTHER): Payer: Medicare Other | Admitting: Medical

## 2022-05-03 VITALS — BP 140/78 | HR 50 | Temp 98.2°F | Resp 18 | Ht 63.0 in | Wt 207.0 lb

## 2022-05-03 DIAGNOSIS — R739 Hyperglycemia, unspecified: Secondary | ICD-10-CM | POA: Diagnosis not present

## 2022-05-03 DIAGNOSIS — M8580 Other specified disorders of bone density and structure, unspecified site: Secondary | ICD-10-CM | POA: Diagnosis not present

## 2022-05-03 DIAGNOSIS — F32A Depression, unspecified: Secondary | ICD-10-CM

## 2022-05-03 DIAGNOSIS — E785 Hyperlipidemia, unspecified: Secondary | ICD-10-CM | POA: Diagnosis not present

## 2022-05-03 DIAGNOSIS — Z780 Asymptomatic menopausal state: Secondary | ICD-10-CM | POA: Diagnosis not present

## 2022-05-03 DIAGNOSIS — Z13820 Encounter for screening for osteoporosis: Secondary | ICD-10-CM | POA: Diagnosis not present

## 2022-05-03 DIAGNOSIS — F419 Anxiety disorder, unspecified: Secondary | ICD-10-CM

## 2022-05-03 LAB — LIPID PANEL
Cholesterol: 179 mg/dL (ref 0–200)
HDL: 47.9 mg/dL (ref >39.00)
LDL Cholesterol: 113 mg/dL — ABNORMAL HIGH (ref 0–99)
NonHDL: 131.39
Total CHOL/HDL Ratio: 4
Triglycerides: 92 mg/dL (ref 0.0–149.0)
VLDL: 18.4 mg/dL (ref 0.0–40.0)

## 2022-05-03 LAB — COMPREHENSIVE METABOLIC PANEL
ALT: 19 U/L (ref 0–35)
AST: 17 U/L (ref 0–37)
Albumin: 3.8 g/dL (ref 3.5–5.2)
Alkaline Phosphatase: 68 U/L (ref 39–117)
BUN: 13 mg/dL (ref 6–23)
CO2: 30 mEq/L (ref 19–32)
Calcium: 8.9 mg/dL (ref 8.4–10.5)
Chloride: 105 mEq/L (ref 96–112)
Creatinine, Ser: 0.54 mg/dL (ref 0.40–1.20)
GFR: 96.25 mL/min (ref >60.00)
Glucose, Bld: 86 mg/dL (ref 70–99)
Potassium: 4.4 mEq/L (ref 3.5–5.1)
Sodium: 140 mEq/L (ref 135–145)
Total Bilirubin: 0.5 mg/dL (ref 0.2–1.2)
Total Protein: 6.4 g/dL (ref 6.0–8.3)

## 2022-05-03 LAB — HEMOGLOBIN A1C: Hgb A1c MFr Bld: 5.8 % (ref 4.6–6.5)

## 2022-05-03 MED ORDER — ESCITALOPRAM OXALATE 20 MG PO TABS: 20.0000 mg | ORAL_TABLET | Freq: Every day | ORAL | 11 refills | Status: AC

## 2022-05-03 NOTE — Patient Instructions (Addendum)
1. Depression and  Anxiety. More anxiety than depression. On discussion option decided would increase lexapro to 20 mg daily. Will see how you do.  2. Hyperlipidemia, unspecified hyperlipidemia type Continue atorvastatin and low cholesterol diet. - Comp Met (CMET) - Lipid panel 3. Elevated blood sugar Recommend low sugar eidet. - Hemoglobin A1c  4. Screening for osteoporosis/. Pt is post-menopause - DG Bone Density; Future   5 elevated blood pressure- 10 points elevated compared to when you were in office last time. Check bp at home with monitor. Update me in one week on daily bp readings. If remaining at today level then would rx low dose bp med.  Follow up 6 months but sooner if needed.(But asked that you send me my chart update how you feel with mood in 2 week)

## 2022-05-03 NOTE — Progress Notes (Signed)
Subjective:    Patient ID: Miranda Cunningham, female    DOB: 1956/01/13, 67 y.o.   MRN: [id number]  HPI  Pt in with some reported high level stress. I have seen for anxiety/stress in past. Stress associated with taking care of her mom. Also some recent stress relating to renewing lease. She has to fill out a lot of paperwork to renew lease. This is more complicated than before. Pt finally got thru with paperwork.   Hx of depression and anxiety. Pt is on lexapro. She has been on lexapro 10 mg daily. She feels like she needs higher dose.   Phq-9 score is  9 gad 7 score is 16  High cholesterol- pt on atorvastatin 10 mg daily. Pt is fasting presently.   The 10-year ASCVD risk score (Arnett DK, et al., 2019) is: 7.4%   Values used to calculate the score:     Age: 57 years     Sex: Female     Is Non-Hispanic African American: No     Diabetic: No     Tobacco smoker: No     Systolic Blood Pressure: [id number] mmHg     Is BP treated: No     HDL Cholesterol: 48.1 mg/dL     Total Cholesterol: 222 mg/dL   Mild elevated sugar in the past.   Borderline bp level today.  Pt declines pneumonia vaccine. Also declines shingrix. Pt indicates does not want to take vaccines presently.     Review of Systems  Constitutional:  Negative for chills, fatigue and fever.  Respiratory:  Negative for chest tightness, shortness of breath and wheezing.   Cardiovascular:  Negative for chest pain.  Gastrointestinal:  Negative for diarrhea, nausea and vomiting.  Musculoskeletal:  Negative for back pain, joint swelling and neck stiffness.  Skin:  Negative for rash.  Neurological:  Negative for dizziness, tremors, seizures and headaches.  Hematological:  Negative for adenopathy. Does not bruise/bleed easily.    Past Medical History:  Diagnosis Date   Allergy    Hyperlipidemia      Social History   Socioeconomic History   Marital status: Divorced    Spouse name: Not on file   Number of children: Not on file    Years of education: Not on file   Highest education level: Not on file  Occupational History   Not on file  Tobacco Use   Smoking status: Never   Smokeless tobacco: Never  Vaping Use   Vaping Use: Never used  Substance and Sexual Activity   Alcohol use: Never   Drug use: Never   Sexual activity: Not Currently  Other Topics Concern   Not on file  Social History Narrative   Not on file   Social Determinants of Health   Financial Resource Strain: Not on file  Food Insecurity: Not on file  Transportation Needs: Not on file  Physical Activity: Not on file  Stress: Not on file  Social Connections: Not on file  Intimate Partner Violence: Not on file    Past Surgical History:  Procedure Laterality Date   BREAST BIOPSY     BREAST EXCISIONAL BIOPSY Right    CARPAL TUNNEL RELEASE     LAPAROSCOPY     SHOULDER ARTHROSCOPY      Family History  Problem Relation Age of Onset   Jaundice Mother    Cirrhosis Mother    Macular degeneration Mother    Colon cancer Neg Hx    Colon polyps Neg Hx  Stomach cancer Neg Hx    Rectal cancer Neg Hx     Allergies  Allergen Reactions   Gramineae Pollens Other (See Comments)    Typical symptoms of Hay Fever, including sneezing Typical symptoms of Hay Fever, including sneezing Typical symptoms of Hay Fever, including sneezing Typical symptoms of Hay Fever, including sneezing Typical symptoms of Hay Fever, including sneezing    Methylpyrrolidone Other (See Comments)    Other reaction(s): Dizziness (intolerance) sneezing sneezing    Milk (Cow) Nausea Only    Current Outpatient Medications on File Prior to Visit  Medication Sig Dispense Refill   atorvastatin (LIPITOR) 10 MG tablet Take 1 tablet (10 mg total) by mouth daily. 30 tablet 11   escitalopram (LEXAPRO) 10 MG tablet Take 1 tablet (10 mg total) by mouth daily. 30 tablet 3   Iodine Strong, Lugols, (IODINE STRONG PO) Take by mouth.     Loratadine (CLARITIN PO) Take by  mouth.     Multiple Vitamins-Minerals (EYE VITAMINS PO) Take by mouth.     No current facility-administered medications on file prior to visit.    BP (!) 140/78   Pulse (!) 50   Temp 98.2 F (36.8 C)   Resp 18   Ht 5\' 3"  (1.6 m)   Wt 207 lb (93.9 kg)   SpO2 93%   BMI 36.67 kg/m        Objective:   Physical Exam  General Mental Status- Alert. General Appearance- Not in acute distress.   Skin General: Color- Normal Color. Moisture- Normal Moisture.  Neck Carotid Arteries- Normal color. Moisture- Normal Moisture. No carotid bruits. No JVD.  Chest and Lung Exam Auscultation: Breath Sounds:-Normal.  Cardiovascular Auscultation:Rythm- Regular. Murmurs & Other Heart Sounds:Auscultation of the heart reveals- No Murmurs.  Abdomen Inspection:-Inspeection Normal. Palpation/Percussion:Note:No mass. Palpation and Percussion of the abdomen reveal- Non Tender, Non Distended + BS, no rebound or guarding.   Neurologic Cranial Nerve exam:- CN III-XII intact(No nystagmus), symmetric smile. Strength:- 5/5 equal and symmetric strength both upper and lower extremities.       Assessment & Plan:   Patient Instructions  1. Depression and  Anxiety. More anxiety than depression. On discussion option decided would increase lexapro to 20 mg daily. Will see how you do.  2. Hyperlipidemia, unspecified hyperlipidemia type Continue atorvastatin and low cholesterol diet. - Comp Met (CMET) - Lipid panel 3. Elevated blood sugar Recommend low sugar eidet. - Hemoglobin A1c  4. Screening for osteoporosis/. Pt is post-menopause - DG Bone Density; Future   5 elevated blood pressure- 10 points elevated compared to when you were in office last time. Check bp at home with monitor. Update me in one week on daily bp readings. If remaining at today level then would rx low dose bp med.  Follow up 6 months but sooner if needed.   Mackie Pai, PA-C

## 2022-05-08 ENCOUNTER — Ambulatory Visit (HOSPITAL_BASED_OUTPATIENT_CLINIC_OR_DEPARTMENT_OTHER)
Admission: RE | Admit: 2022-05-08 | Discharge: 2022-05-08 | Disposition: A | Discharge status: Home / Self Care | Payer: Medicare Other | Source: Ambulatory Visit | Attending: Medical | Admitting: Medical

## 2022-05-08 DIAGNOSIS — Z13820 Encounter for screening for osteoporosis: Secondary | ICD-10-CM | POA: Diagnosis not present

## 2022-05-08 DIAGNOSIS — Z780 Asymptomatic menopausal state: Secondary | ICD-10-CM | POA: Insufficient documentation

## 2022-05-08 DIAGNOSIS — M85851 Other specified disorders of bone density and structure, right thigh: Secondary | ICD-10-CM | POA: Diagnosis not present

## 2022-05-10 MED ORDER — ALENDRONATE SODIUM 10 MG PO TABS: 10.0000 mg | ORAL_TABLET | Freq: Every day | ORAL | 11 refills | Status: AC

## 2022-05-10 NOTE — Addendum Note (Signed)
Addended by: Anabel Halon on: 05/10/2022 01:07 PM   Modules accepted: Orders

## 2022-05-15 ENCOUNTER — Other Ambulatory Visit (INDEPENDENT_AMBULATORY_CARE_PROVIDER_SITE_OTHER): Payer: Medicare Other

## 2022-05-15 DIAGNOSIS — M8580 Other specified disorders of bone density and structure, unspecified site: Secondary | ICD-10-CM | POA: Diagnosis not present

## 2022-05-15 LAB — VITAMIN D 25 HYDROXY (VIT D DEFICIENCY, FRACTURES): VITD: 18.02 ng/mL — ABNORMAL LOW (ref 30.00–100.00)

## 2022-05-15 MED ORDER — VITAMIN D (ERGOCALCIFEROL) 1.25 MG (50000 UNIT) PO CAPS: 50000.0000 [IU] | ORAL_CAPSULE | ORAL | 0 refills | Status: DC

## 2022-05-15 NOTE — Addendum Note (Signed)
Addended by: Anabel Halon on: 05/15/2022 07:30 PM   Modules accepted: Orders

## 2022-05-25 ENCOUNTER — Telehealth: Payer: Self-pay | Admitting: Medical

## 2022-05-25 NOTE — Telephone Encounter (Signed)
FYI: This call has been transferred to Access Nurse. Once the result note has been entered staff can address the message at that time.  Patient called in with the following symptoms:  Red Word: Swelling of Feet   Please advise at Mobile [phone] (mobile)  Message is routed to Provider Pool and [facility] Triage

## 2022-05-26 NOTE — Telephone Encounter (Signed)
Pt called and lvm to return call to check status and offer OV

## 2022-05-26 NOTE — Telephone Encounter (Signed)
Initial Comment Caller states she is having swelling and redness in legs, can barely walk. Taking Vitamin D and Fosamax. Translation No Nurse Assessment Nurse: Delford Field, RN, Ivin Booty Date/Time Lamount Cohen Time): 05/25/2022 3:20:21 PM Confirm and document reason for call. If symptomatic, describe symptoms. ---Caller states she is having swelling and redness in legs, can barely walk. Taking Vitamin D and Fosamax. it has been present for the last couple days, is up to the knees and feels warm. Does the patient have any new or worsening symptoms? ---Yes Will a triage be completed? ---Yes Related visit to physician within the last 2 weeks? ---No Does the PT have any chronic conditions? (i.e. diabetes, asthma, this includes High risk factors for pregnancy, etc.) ---No Is this a behavioral health or substance abuse call? ---No Guidelines Guideline Title Affirmed Question Affirmed Notes Nurse Date/Time Lamount Cohen Time) Leg Swelling and Edema [1] Red area or streak [2] large (> 2 in. or 5 cm) Delford Field, RN, Ivin Booty 05/25/2022 3:21:27 PM Disp. Time Lamount Cohen Time) Disposition Final User 05/25/2022 3:25:31 PM See HCP within 4 Hours (or PCP triage) Yes Delford Field, RN, Ivin Booty PLEASE NOTE: All timestamps contained within this report are represented as [place] Standard Time. CONFIDENTIALTY NOTICE: This fax transmission is intended only for the addressee. It contains information that is legally privileged, confidential or otherwise protected from use or disclosure. If you are not the intended recipient, you are strictly prohibited from reviewing, disclosing, copying using or disseminating any of this information or taking any action in reliance on or regarding this information. If you have received this fax in error, please notify us immediately by telephone so that we can arrange for its return to [place]. Phone: [phone], Toll-Free: [phone], Fax: [phone] Page: 2 of 2 Call Id: [id number] Final  Disposition 05/25/2022 3:25:31 PM See HCP within 4 Hours (or PCP triage) Yes Delford Field, RN, Madelynn Done Disagree/Comply Comply Caller Understands Yes PreDisposition Call Doctor Care Advice Given Per Guideline SEE HCP (OR PCP TRIAGE) WITHIN 4 HOURS: * IF OFFICE WILL BE OPEN: You need to be seen within the next 3 or 4 hours. Call your doctor (or NP/PA) now or as soon as the office opens. CALL BACK IF: * You become worse CARE ADVICE given per Leg Swelling and Edema (Adult) guideline. Comments User: Cordella Register, RN Date/Time Lamount Cohen Time): 05/25/2022 3:25:46 PM no appointment available; pt directed to UC. Referrals Warm transfer to backline GO TO FACILITY UNDECIDED

## 2022-05-28 ENCOUNTER — Ambulatory Visit (INDEPENDENT_AMBULATORY_CARE_PROVIDER_SITE_OTHER): Payer: Medicare Other

## 2022-05-28 ENCOUNTER — Ambulatory Visit
Admission: EM | Admit: 2022-05-28 | Discharge: 2022-05-28 | Disposition: A | Discharge status: Home / Self Care | Payer: Medicare Other | Attending: Family Medicine | Admitting: Family Medicine

## 2022-05-28 ENCOUNTER — Encounter: Payer: Self-pay | Admitting: Emergency Medicine

## 2022-05-28 DIAGNOSIS — M25572 Pain in left ankle and joints of left foot: Secondary | ICD-10-CM | POA: Diagnosis not present

## 2022-05-28 DIAGNOSIS — S93492A Sprain of other ligament of left ankle, initial encounter: Secondary | ICD-10-CM | POA: Diagnosis not present

## 2022-05-28 DIAGNOSIS — R21 Rash and other nonspecific skin eruption: Secondary | ICD-10-CM

## 2022-05-28 DIAGNOSIS — R600 Localized edema: Secondary | ICD-10-CM

## 2022-05-28 MED ORDER — FUROSEMIDE 20 MG PO TABS: 20.0000 mg | ORAL_TABLET | Freq: Every day | ORAL | 0 refills | Status: AC
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
## 2022-05-28 NOTE — Discharge Instructions (Signed)
I do not believe the rash is related to an allergic reaction to your vitamin D or your Fosamax.  Please try to continue taking these medications I am prescribing Lasix (furosemide) for the swelling.  This will make you urinate more frequently.  Take it early in the day.  Take for 3 days in a row and then see how you do You need to call your primary care doctor if you end up needing to stay on the furosemide

## 2022-05-28 NOTE — ED Triage Notes (Signed)
Left foot pain x 1 week Pt rolled left foot - pain since then  Left ankle swollen  R ankle is always swollen  Pt took 2 doses of fosamax & vitamin  D - rash came up after that to BLE  Hives have resolved  OTC - daily allergy meds Pt is on her feet all day  at work

## 2022-06-12 ENCOUNTER — Telehealth: Payer: Self-pay | Admitting: Medical

## 2022-06-12 NOTE — Telephone Encounter (Signed)
Copied from CRM [id number]. Topic: Medicare AWV >> Jun 12, 2022 10:18 AM Payton Doughty wrote: Reason for CRM: Called patient to schedule Medicare Annual Wellness Visit (AWV). Left message for patient to call back and schedule Medicare Annual Wellness Visit (AWV).  Last date of AWV: none  Please schedule an appointment at any time with Donne Anon, CMA  .  If any questions, please contact me.  Thank you ,  Verlee Rossetti; Care Guide Ambulatory Clinical Support [place] [facility] Direct Dial: [phone]

## 2022-09-28 ENCOUNTER — Encounter (INDEPENDENT_AMBULATORY_CARE_PROVIDER_SITE_OTHER): Payer: Self-pay

## 2022-11-08 ENCOUNTER — Ambulatory Visit: Payer: Medicare Other | Admitting: Medical

## 2022-11-08 ENCOUNTER — Encounter: Payer: Self-pay | Admitting: Medical

## 2022-11-08 ENCOUNTER — Other Ambulatory Visit (HOSPITAL_BASED_OUTPATIENT_CLINIC_OR_DEPARTMENT_OTHER): Payer: Self-pay | Admitting: Medical

## 2022-11-08 VITALS — BP 126/70 | HR 61 | Temp 98.3°F | Resp 18 | Ht 63.0 in | Wt 198.0 lb

## 2022-11-08 DIAGNOSIS — F419 Anxiety disorder, unspecified: Secondary | ICD-10-CM

## 2022-11-08 DIAGNOSIS — R739 Hyperglycemia, unspecified: Secondary | ICD-10-CM

## 2022-11-08 DIAGNOSIS — E782 Mixed hyperlipidemia: Secondary | ICD-10-CM | POA: Diagnosis not present

## 2022-11-08 DIAGNOSIS — E785 Hyperlipidemia, unspecified: Secondary | ICD-10-CM | POA: Diagnosis not present

## 2022-11-08 DIAGNOSIS — E559 Vitamin D deficiency, unspecified: Secondary | ICD-10-CM

## 2022-11-08 DIAGNOSIS — F32A Depression, unspecified: Secondary | ICD-10-CM | POA: Diagnosis not present

## 2022-11-08 DIAGNOSIS — Z1231 Encounter for screening mammogram for malignant neoplasm of breast: Secondary | ICD-10-CM

## 2022-11-08 LAB — LIPID PANEL
Cholesterol: 222 mg/dL — ABNORMAL HIGH (ref 0–200)
HDL: 45.6 mg/dL (ref >39.00)
LDL Cholesterol: 155 mg/dL — ABNORMAL HIGH (ref 0–99)
NonHDL: 176.67
Total CHOL/HDL Ratio: 5
Triglycerides: 107 mg/dL (ref 0.0–149.0)
VLDL: 21.4 mg/dL (ref 0.0–40.0)

## 2022-11-08 LAB — COMPREHENSIVE METABOLIC PANEL
ALT: 11 U/L (ref 0–35)
AST: 13 U/L (ref 0–37)
Albumin: 3.8 g/dL (ref 3.5–5.2)
Alkaline Phosphatase: 62 U/L (ref 39–117)
BUN: 11 mg/dL (ref 6–23)
CO2: 30 mEq/L (ref 19–32)
Calcium: 9.1 mg/dL (ref 8.4–10.5)
Chloride: 105 mEq/L (ref 96–112)
Creatinine, Ser: 0.67 mg/dL (ref 0.40–1.20)
GFR: 91.04 mL/min (ref >60.00)
Glucose, Bld: 96 mg/dL (ref 70–99)
Potassium: 4.2 mEq/L (ref 3.5–5.1)
Sodium: 142 mEq/L (ref 135–145)
Total Bilirubin: 0.5 mg/dL (ref 0.2–1.2)
Total Protein: 6.5 g/dL (ref 6.0–8.3)

## 2022-11-08 LAB — VITAMIN D 25 HYDROXY (VIT D DEFICIENCY, FRACTURES): VITD: 19.85 ng/mL — ABNORMAL LOW (ref 30.00–100.00)

## 2022-11-08 LAB — HEMOGLOBIN A1C: Hgb A1c MFr Bld: 5.7 % (ref 4.6–6.5)

## 2022-11-08 MED ORDER — ATORVASTATIN CALCIUM 10 MG PO TABS: 10.0000 mg | ORAL_TABLET | Freq: Every day | ORAL | 3 refills | Status: AC

## 2022-11-08 NOTE — Progress Notes (Signed)
Subjective:    Patient ID: Miranda Cunningham, female    DOB: [birth year], 67 y.o.   MRN: [id number]  HPI  Pt in for follow up.  Last AVS below in ".  "1. Depression and  Anxiety. More anxiety than depression. On discussion option decided would increase lexapro to 20 mg daily. Will see how you do.   2. Hyperlipidemia, unspecified hyperlipidemia type Continue atorvastatin and low cholesterol diet. - Comp Met (CMET) - Lipid panel 3. Elevated blood sugar Recommend low sugar eidet. - Hemoglobin A1c"  Pt phq-9 score is 9 and Gad-7 is 8. Some stress related to taking care of her mom. Pt thinks mom has dementia. Pt states on lexapro 10 mg but on med list is 20 mg daily. Pt feels like anxiety worse than depression.  High cholesterol- last check total cholesterol was improved. Pt not on cholesterol medication. She is fasting.  Last A1c was in prediabetic range.   On review last vit d level was low. Pt has been on vit d weekly for 8 weeks. She did not get repeat vit d level at 9 weeks.    Review of Systems  Constitutional:  Negative for chills, fatigue and fever.  Respiratory:  Negative for chest tightness, shortness of breath and wheezing.   Cardiovascular:  Negative for chest pain and palpitations.  Gastrointestinal:  Negative for abdominal pain.  Musculoskeletal:  Negative for back pain.  Skin:  Negative for rash.  Neurological:  Negative for dizziness, weakness and numbness.  Hematological:  Negative for adenopathy.  Psychiatric/Behavioral:  Positive for dysphoric mood. Negative for behavioral problems and suicidal ideas. The patient is nervous/anxious.     Past Medical History:  Diagnosis Date   Allergy    Hyperlipidemia      Social History   Socioeconomic History   Marital status: Divorced    Spouse name: Not on file   Number of children: Not on file   Years of education: Not on file   Highest education level: Not on file  Occupational History   Not on file  Tobacco  Use   Smoking status: Never   Smokeless tobacco: Never  Vaping Use   Vaping status: Never Used  Substance and Sexual Activity   Alcohol use: Never   Drug use: Never   Sexual activity: Not Currently  Other Topics Concern   Not on file  Social History Narrative   Not on file   Social Determinants of Health   Financial Resource Strain: Not on file  Food Insecurity: Not on file  Transportation Needs: Not on file  Physical Activity: Not on file  Stress: Not on file  Social Connections: Unknown (06/30/2021)   Received from [facility]   Social Network    Social Network: Not on file  Intimate Partner Violence: Unknown (06/30/2021)   Received from [facility], [facility]   HITS    Physically Hurt: Not on file    Insult or Talk Down To: Not on file    Threaten Physical Harm: Not on file    Scream or Curse: Not on file    Past Surgical History:  Procedure Laterality Date   BREAST BIOPSY     BREAST EXCISIONAL BIOPSY Right    CARPAL TUNNEL RELEASE     LAPAROSCOPY     SHOULDER ARTHROSCOPY      Family History  Problem Relation Age of Onset   Jaundice Mother    Cirrhosis Mother    Macular degeneration Mother  Colon cancer Neg Hx    Colon polyps Neg Hx    Stomach cancer Neg Hx    Rectal cancer Neg Hx     Allergies  Allergen Reactions   Gramineae Pollens Other (See Comments)    Typical symptoms of Hay Fever, including sneezing Typical symptoms of Hay Fever, including sneezing Typical symptoms of Hay Fever, including sneezing Typical symptoms of Hay Fever, including sneezing Typical symptoms of Hay Fever, including sneezing    Methylpyrrolidone Other (See Comments)    Other reaction(s): Dizziness (intolerance) sneezing sneezing    Milk (Cow) Nausea Only    Current Outpatient Medications on File Prior to Visit  Medication Sig Dispense Refill   alendronate (FOSAMAX) 10 MG tablet Take 1 tablet (10 mg total) by mouth daily before breakfast.  Take with a full glass of water on an empty stomach. 30 tablet 11   escitalopram (LEXAPRO) 20 MG tablet Take 1 tablet (20 mg total) by mouth daily. 30 tablet 11   furosemide (LASIX) 20 MG tablet Take 1 tablet (20 mg total) by mouth daily. 10 tablet 0   Iodine Strong, Lugols, (IODINE STRONG PO) Take by mouth.     Loratadine (CLARITIN PO) Take by mouth.     Multiple Vitamins-Minerals (EYE VITAMINS PO) Take by mouth.     Vitamin D, Ergocalciferol, (DRISDOL) 1.25 MG (50000 UNIT) CAPS capsule Take 1 capsule (50,000 Units total) by mouth every 7 (seven) days. 8 capsule 0   No current facility-administered medications on file prior to visit.    BP 126/70 (BP Location: Right Arm, Patient Position: Sitting, Cuff Size: Large)   Pulse 61   Temp 98.3 F (36.8 C) (Oral)   Resp 18   Ht 5\' 3"  (1.6 m)   Wt 198 lb (89.8 kg)   SpO2 95%   BMI 35.07 kg/m        Objective:   Physical Exam  General Mental Status- Alert. General Appearance- Not in acute distress.   Skin General: Color- Normal Color. Moisture- Normal Moisture.  Neck Carotid Arteries- Normal color. Moisture- Normal Moisture. No carotid bruits. No JVD.  Chest and Lung Exam Auscultation: Breath Sounds:-Normal.  Cardiovascular Auscultation:Rythm- Regular. Murmurs & Other Heart Sounds:Auscultation of the heart reveals- No Murmurs.  Abdomen Inspection:-Inspeection Normal. Palpation/Percussion:Note:No mass. Palpation and Percussion of the abdomen reveal- Non Tender, Non Distended + BS, no rebound or guarding.  Neurologic Cranial Nerve exam:- CN III-XII intact(No nystagmus), symmetric smile. Strength:- 5/5 equal and symmetric strength both upper and lower extremities.       Assessment & Plan:   Patient Instructions   Anxiety and Depression,  -check to see if you are on lexapro 10 mg or 20 mg. Chart indicates you are on 20 mg but you state 10 mg dose. If you are on 20 mg daily then would recommend buspar 7.5 mg twice daily  as add on to treat your anxiety which you report is worse. If on 10 mg then can increase lexapro.  Hyperlipidemia, unspecified hyperlipidemia type - will check fasting lipid panel.  Elevated blood sugar -low sugar diet and reheck a1c   Vitamin D deficiency -vit d level today. Then deterine if you need to get back on supplament.  Follow up date to be determined after lab review.     Esperanza Richters, PA-C

## 2022-11-08 NOTE — Patient Instructions (Addendum)
Anxiety and Depression,  -check to see if you are on lexapro 10 mg or 20 mg. Chart indicates you are on 20 mg but you state 10 mg dose. If you are on 20 mg daily then would recommend buspar 7.5 mg twice daily as add on to treat your anxiety which you report is worse. If on 10 mg then can increase lexapro.  Hyperlipidemia, unspecified hyperlipidemia type - will check fasting lipid panel.  Elevated blood sugar -low sugar diet and reheck a1c   Vitamin D deficiency -vit d level today. Then deterine if you need to get back on supplament.  Follow up date to be determined after lab review.

## 2022-11-09 MED ORDER — VITAMIN D (ERGOCALCIFEROL) 1.25 MG (50000 UNIT) PO CAPS: 50000.0000 [IU] | ORAL_CAPSULE | ORAL | 0 refills | Status: DC

## 2022-11-09 NOTE — Addendum Note (Signed)
Addended by: Gwenevere Abbot on: 11/09/2022 05:18 AM   Modules accepted: Orders

## 2022-11-14 ENCOUNTER — Telehealth: Payer: Self-pay | Admitting: Medical

## 2022-11-14 MED ORDER — BUSPIRONE HCL 7.5 MG PO TABS: 7.5000 mg | ORAL_TABLET | Freq: Two times a day (BID) | ORAL | 0 refills | Status: AC

## 2022-11-14 NOTE — Addendum Note (Signed)
Addended by: Gwenevere Abbot on: 11/14/2022 07:18 PM   Modules accepted: Orders

## 2022-11-14 NOTE — Telephone Encounter (Signed)
Pt called stating that she wanted to have Edward send in the Buspar they had talked about to her pharmacy and that she is still taking that 20 mg of Lexapro:  [id number] - [place], [place] - [address], [place] [address] Phone: [phone]  Fax: [phone]

## 2022-11-15 NOTE — Telephone Encounter (Signed)
Pt notified and appt made

## 2022-12-11 ENCOUNTER — Inpatient Hospital Stay (HOSPITAL_BASED_OUTPATIENT_CLINIC_OR_DEPARTMENT_OTHER): Admission: RE | Admit: 2022-12-11 | Payer: Medicare Other | Source: Ambulatory Visit

## 2022-12-19 ENCOUNTER — Ambulatory Visit (INDEPENDENT_AMBULATORY_CARE_PROVIDER_SITE_OTHER): Payer: Medicare Other | Admitting: Medical

## 2022-12-19 ENCOUNTER — Encounter: Payer: Self-pay | Admitting: Medical

## 2022-12-19 VITALS — BP 138/80 | HR 64 | Temp 98.0°F | Resp 18 | Ht 63.0 in | Wt 197.0 lb

## 2022-12-19 DIAGNOSIS — E559 Vitamin D deficiency, unspecified: Secondary | ICD-10-CM

## 2022-12-19 DIAGNOSIS — Z832 Family history of diseases of the blood and blood-forming organs and certain disorders involving the immune mechanism: Secondary | ICD-10-CM | POA: Diagnosis not present

## 2022-12-19 DIAGNOSIS — D6851 Activated protein C resistance: Secondary | ICD-10-CM

## 2022-12-19 DIAGNOSIS — F32A Depression, unspecified: Secondary | ICD-10-CM | POA: Diagnosis not present

## 2022-12-19 DIAGNOSIS — F419 Anxiety disorder, unspecified: Secondary | ICD-10-CM | POA: Diagnosis not present

## 2022-12-19 DIAGNOSIS — E785 Hyperlipidemia, unspecified: Secondary | ICD-10-CM | POA: Diagnosis not present

## 2022-12-19 NOTE — Addendum Note (Signed)
Addended by: Gwenevere Abbot on: 12/19/2022 08:33 AM   Modules accepted: Orders

## 2022-12-19 NOTE — Progress Notes (Signed)
Subjective:    Patient ID: Miranda Cunningham, female    DOB: 04-Sep-1955, 67 y.o.   MRN: [id number]  HPI  Pt in for follow up.  Last visit AVS below in ".  "Anxiety and Depression,  -check to see if you are on lexapro 10 mg or 20 mg. Chart indicates you are on 20 mg but you state 10 mg dose. If you are on 20 mg daily then would recommend buspar 7.5 mg twice daily as add on to treat your anxiety which you report is worse. If on 10 mg then can increase lexapro.   Hyperlipidemia, unspecified hyperlipidemia type - will check fasting lipid panel.   Elevated blood sugar -low sugar diet and reheck a1c    Vitamin D deficiency -vit d level today. Then deterine if you need to get back on supplament."  Pt never clarified with me her dose of lexapro. Chart states she is on lexapro 20 mg daily. Reminded pt if that is not the case to let me know. Pt did use buspar 7.5 mg twice daily. She thinks it did help with anxiety. She used it for 3-4 days twice a day but now just once a day. Early on when took buspar twice daily she questions whether gave her loose stools.   Pt has high cholesterol and is on atorvastatin.  3 month blood sugar average about 118.   Low vit D. Pt on erocalciferol 50,000 units weekly for 8 weeks   Pt has family hx of factor v leiden deficiency. Pt has no hx of smoking and no hx of dvt. No leg pain. Just found out mother has factor v leidin deficiency.   Review of Systems  Constitutional:  Negative for chills, fatigue and fever.  Respiratory:  Negative for cough, chest tightness and wheezing.   Cardiovascular:  Negative for chest pain and palpitations.  Gastrointestinal:  Negative for abdominal pain.  Genitourinary:  Negative for dysuria.  Musculoskeletal:  Negative for back pain and myalgias.  Skin:  Negative for pallor and rash.  Neurological:  Negative for dizziness, seizures, weakness and headaches.  Hematological:  Negative for adenopathy. Does not bruise/bleed  easily.  Psychiatric/Behavioral:  Positive for dysphoric mood. Negative for behavioral problems, confusion and suicidal ideas. The patient is nervous/anxious.       Past Medical History:  Diagnosis Date   Allergy    Hyperlipidemia      Social History   Socioeconomic History   Marital status: Divorced    Spouse name: Not on file   Number of children: Not on file   Years of education: Not on file   Highest education level: Not on file  Occupational History   Not on file  Tobacco Use   Smoking status: Never   Smokeless tobacco: Never  Vaping Use   Vaping status: Never Used  Substance and Sexual Activity   Alcohol use: Never   Drug use: Never   Sexual activity: Not Currently  Other Topics Concern   Not on file  Social History Narrative   Not on file   Social Determinants of Health   Financial Resource Strain: Not on file  Food Insecurity: Not on file  Transportation Needs: Not on file  Physical Activity: Not on file  Stress: Not on file  Social Connections: Unknown (06/30/2021)   Received from [facility], [facility]   Social Network    Social Network: Not on file  Intimate Partner Violence: Unknown (06/30/2021)   Received from [facility]   HITS    Physically Hurt: Not on file    Insult or Talk Down To: Not on file    Threaten Physical Harm: Not on file    Scream or Curse: Not on file    Past Surgical History:  Procedure Laterality Date   BREAST BIOPSY     BREAST EXCISIONAL BIOPSY Right    CARPAL TUNNEL RELEASE     LAPAROSCOPY     SHOULDER ARTHROSCOPY      Family History  Problem Relation Age of Onset   Jaundice Mother    Cirrhosis Mother    Macular degeneration Mother    Colon cancer Neg Hx    Colon polyps Neg Hx    Stomach cancer Neg Hx    Rectal cancer Neg Hx     Allergies  Allergen Reactions   Gramineae Pollens Other (See Comments)    Typical symptoms of Hay Fever, including sneezing Typical symptoms of Hay Fever,  including sneezing Typical symptoms of Hay Fever, including sneezing Typical symptoms of Hay Fever, including sneezing Typical symptoms of Hay Fever, including sneezing    Methylpyrrolidone Other (See Comments)    Other reaction(s): Dizziness (intolerance) sneezing sneezing    Milk (Cow) Nausea Only    Current Outpatient Medications on File Prior to Visit  Medication Sig Dispense Refill   alendronate (FOSAMAX) 10 MG tablet Take 1 tablet (10 mg total) by mouth daily before breakfast. Take with a full glass of water on an empty stomach. 30 tablet 11   atorvastatin (LIPITOR) 10 MG tablet Take 1 tablet (10 mg total) by mouth daily. 90 tablet 3   busPIRone (BUSPAR) 7.5 MG tablet Take 1 tablet (7.5 mg total) by mouth 2 (two) times daily. 60 tablet 0   escitalopram (LEXAPRO) 20 MG tablet Take 1 tablet (20 mg total) by mouth daily. 30 tablet 11   furosemide (LASIX) 20 MG tablet Take 1 tablet (20 mg total) by mouth daily. 10 tablet 0   Iodine Strong, Lugols, (IODINE STRONG PO) Take by mouth.     Loratadine (CLARITIN PO) Take by mouth.     Multiple Vitamins-Minerals (EYE VITAMINS PO) Take by mouth.     Vitamin D, Ergocalciferol, (DRISDOL) 1.25 MG (50000 UNIT) CAPS capsule Take 1 capsule (50,000 Units total) by mouth every 7 (seven) days. 8 capsule 0   No current facility-administered medications on file prior to visit.    BP 138/80   Pulse 64   Temp 98 F (36.7 C)   Resp 18   Ht 5\' 3"  (1.6 m)   Wt 197 lb (89.4 kg)   SpO2 96%   BMI 34.90 kg/m           Objective:   Physical Exam  General Mental Status- Alert. General Appearance- Not in acute distress.   Skin General: Color- Normal Color. Moisture- Normal Moisture.  Neck Carotid Arteries- Normal color. Moisture- Normal Moisture. No carotid bruits. No JVD.  Chest and Lung Exam Auscultation: Breath Sounds:-Normal.  Cardiovascular Auscultation:Rythm- Regular. Murmurs & Other Heart Sounds:Auscultation of the heart  reveals- No Murmurs.  Abdomen Inspection:-Inspeection Normal. Palpation/Percussion:Note:No mass. Palpation and Percussion of the abdomen reveal- Non Tender, Non Distended + BS, no rebound or guarding.   Neurologic Cranial Nerve exam:- CN III-XII intact(No nystagmus), symmetric smile. Strength:- 5/5 equal and symmetric strength both upper and lower extremities.       Assessment & Plan:   Patient Instructions  1. Family history of factor V Leiden mutation -no  hx of dvt and non smoker. Will just do below test and not refer presently. If + test results then refer - Factor 5 leiden  2. Anxiety -improved anxiety. Continue lexapro 20 mg daily and buspar 7.5 mg daily. You could try twice daily again and see if loose stools return.  3. Hyperlipidemia, unspecified hyperlipidemia type -continue atorvastatin  4. Depression, unspecified depression type -stable mood. Continue lexapro 20 mg daily  5. Vit d deficiency -placing future vit D order to get done 1st or 2nd week of December  Follow up 6 months or sooner if needed     [employer], [employer]

## 2022-12-19 NOTE — Patient Instructions (Addendum)
1. Family history of factor V Leiden mutation -no hx of dvt and non smoker. Will just do below test and not refer presently. If + test results then refer - Factor 5 leiden  2. Anxiety -improved anxiety. Continue lexapro 20 mg daily and buspar 7.5 mg daily. You could try twice daily again and see if loose stools return.  3. Hyperlipidemia, unspecified hyperlipidemia type -continue atorvastatin  4. Depression, unspecified depression type -stable mood. Continue lexapro 20 mg daily  5. Vit d deficiency -placing future vit D order to get done 1st or 2nd week of December  Follow up 6 months or sooner if needed

## 2022-12-26 ENCOUNTER — Encounter (HOSPITAL_BASED_OUTPATIENT_CLINIC_OR_DEPARTMENT_OTHER): Payer: Self-pay

## 2022-12-26 ENCOUNTER — Ambulatory Visit (HOSPITAL_BASED_OUTPATIENT_CLINIC_OR_DEPARTMENT_OTHER)
Admission: RE | Admit: 2022-12-26 | Discharge: 2022-12-26 | Disposition: A | Discharge status: Home / Self Care | Payer: Medicare Other | Source: Ambulatory Visit | Attending: Medical | Admitting: Medical

## 2022-12-26 DIAGNOSIS — Z1231 Encounter for screening mammogram for malignant neoplasm of breast: Secondary | ICD-10-CM | POA: Insufficient documentation

## 2022-12-28 ENCOUNTER — Telehealth: Payer: Self-pay | Admitting: *Deleted

## 2022-12-28 NOTE — Telephone Encounter (Signed)
Received fax from [facility], [place] stating patient result has been affected by a testing delay 'due to Assay capacity has exceeded". If the resolution date is extended, another fax notification will be sent.  Expected resolution date is 12/29/22.

## 2022-12-29 LAB — FACTOR 5 LEIDEN: Result: POSITIVE — AB

## 2022-12-29 NOTE — Telephone Encounter (Signed)
Yes, I'm sorry. This is regarding Factor V Leiden.

## 2022-12-30 NOTE — Addendum Note (Signed)
Addended by: Gwenevere Abbot on: 12/30/2022 07:51 AM   Modules accepted: Orders

## 2023-01-14 IMAGING — MG MM DIGITAL SCREENING BILAT W/ TOMO AND CAD
8 series · 8 of 24 positions shown · non-contrast
Comparison: Previous exam(s).

CLINICAL DATA: Screening.

EXAM:
DIGITAL SCREENING BILATERAL MAMMOGRAM WITH TOMOSYNTHESIS AND CAD
TECHNIQUE: Bilateral screening digital craniocaudal and mediolateral oblique
mammograms were obtained. Bilateral screening digital breast
tomosynthesis was performed. The images were evaluated with
computer-aided detection.

[R CC synth-2D]
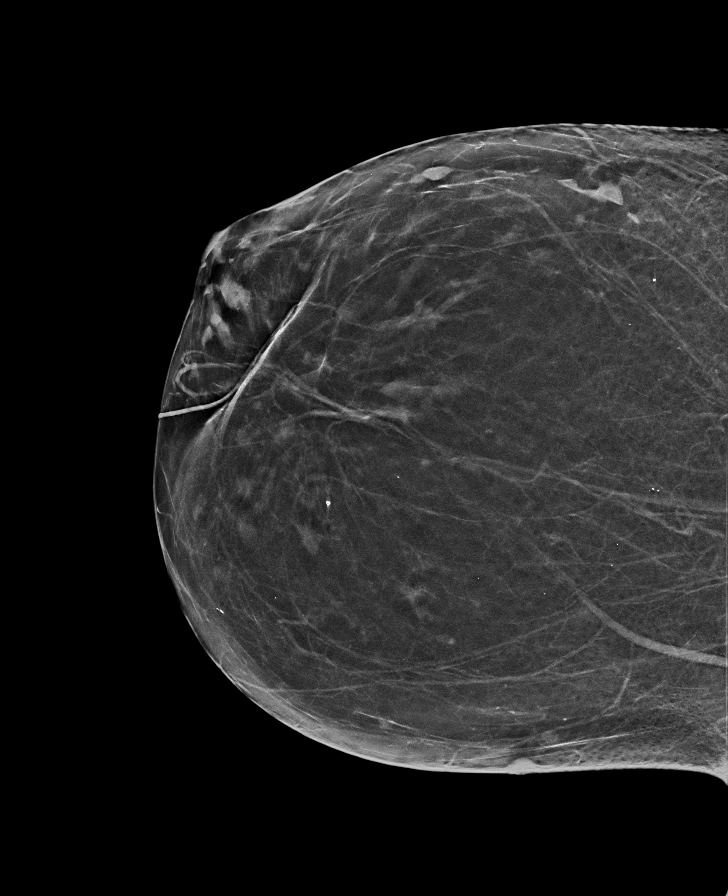

[L MLO synth-2D]
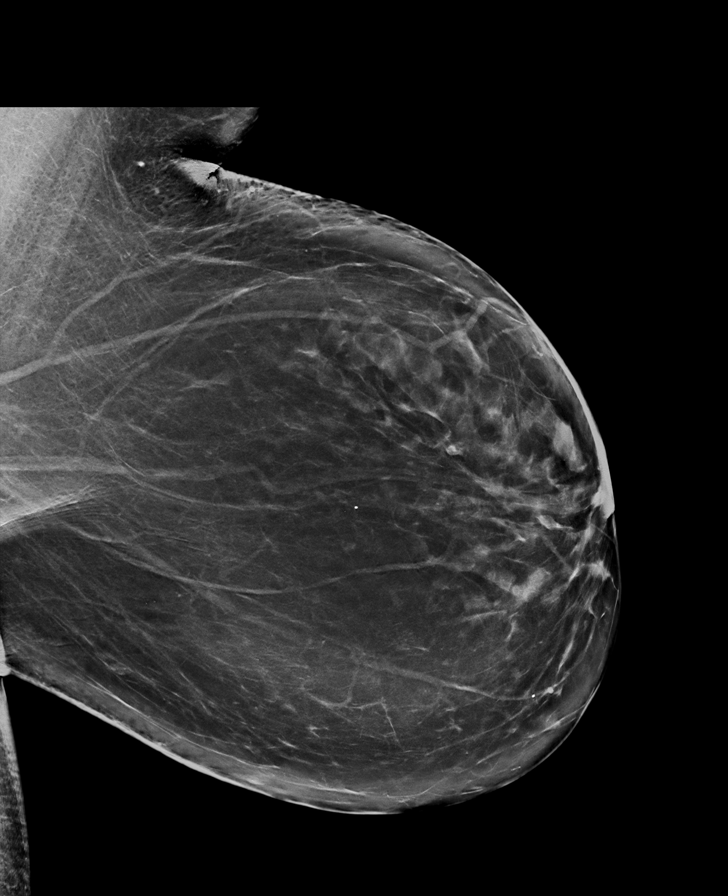

[L CC synth-2D]
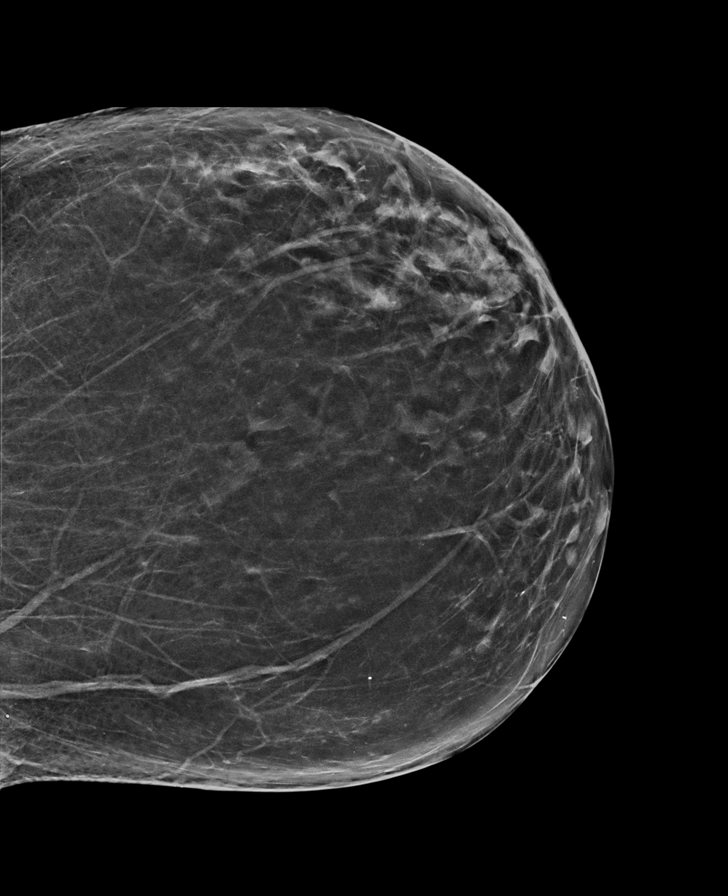

[R MLO synth-2D]
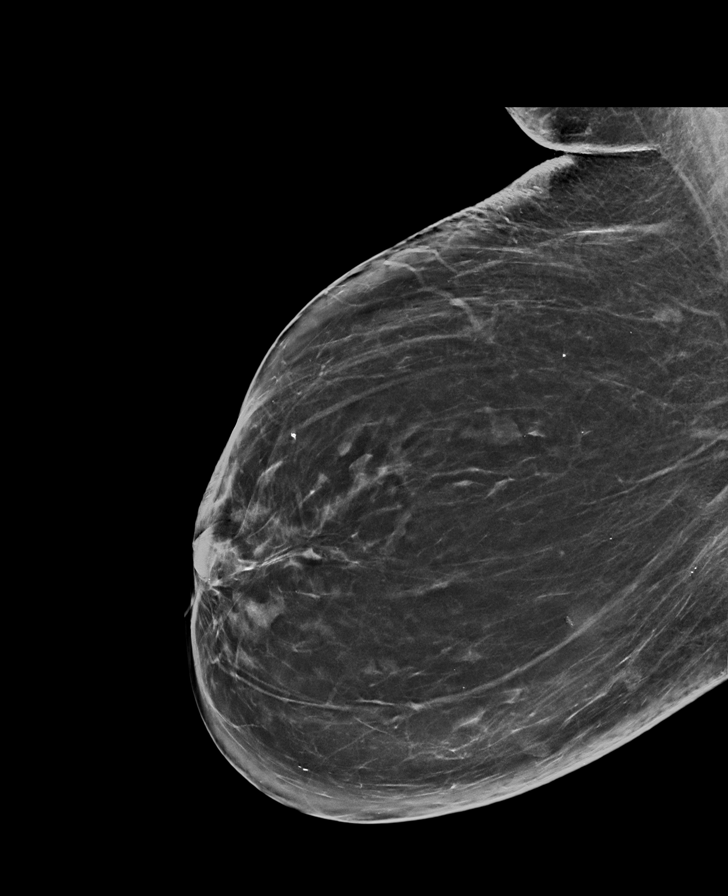

[L CC tomo · tomo slice 33/65.0]
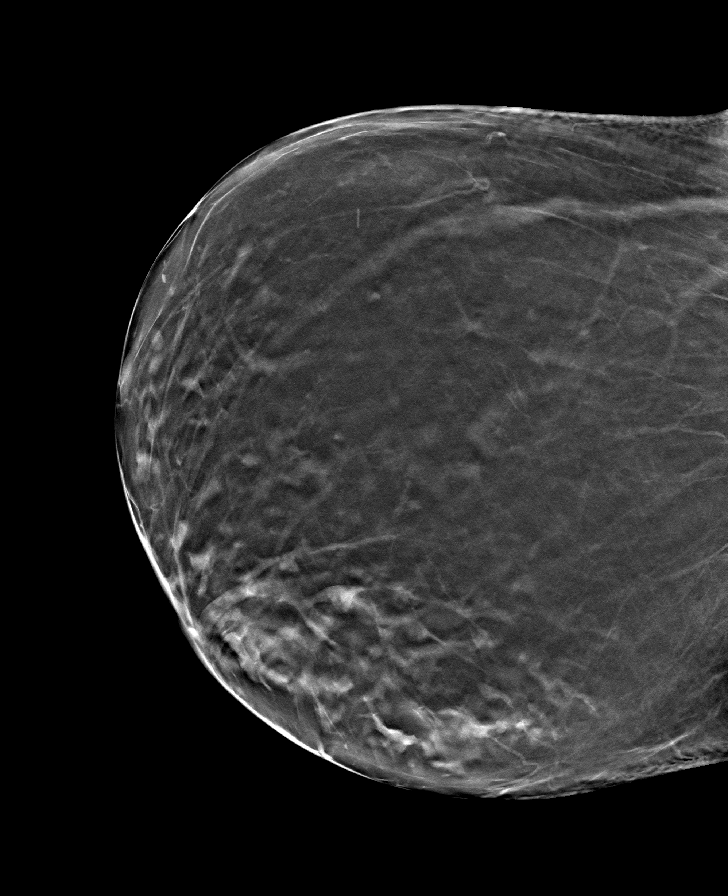

[R CC tomo · tomo slice 31/62.0]
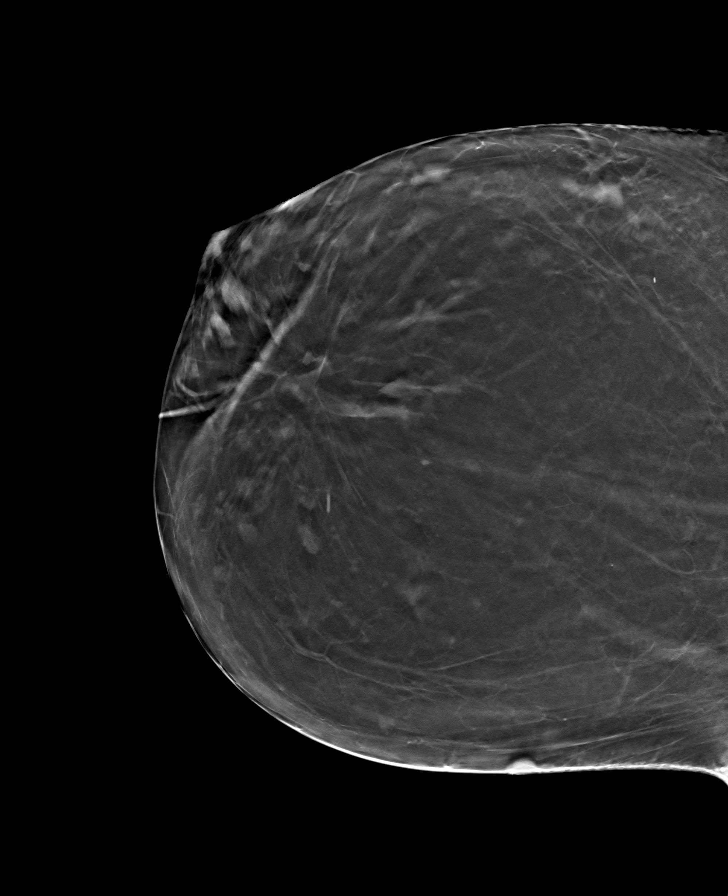

[L MLO tomo · tomo slice 43/86.0]
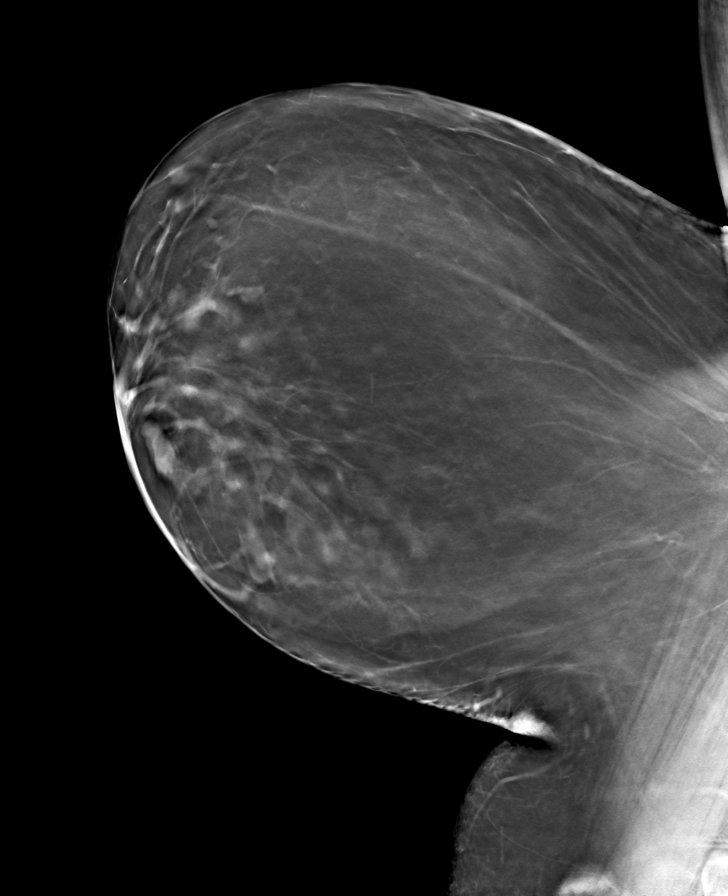

[R MLO tomo · tomo slice 38/75.0]
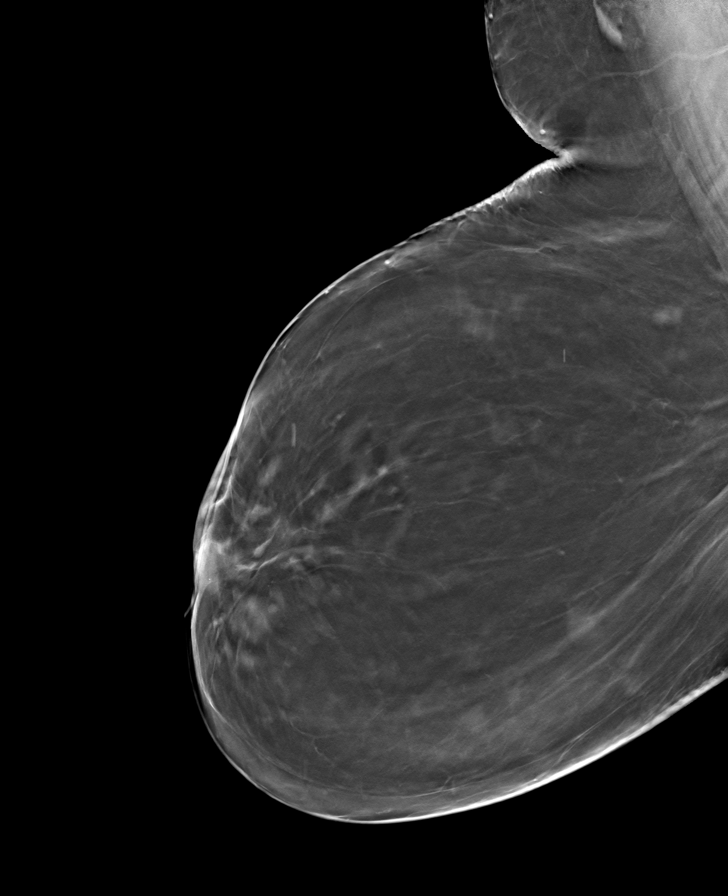

[8 of 24 positions shown; findings below may reference images not displayed]

ACR Breast Density Category b: There are scattered areas of
fibroglandular density.
FINDINGS: There are no findings suspicious for malignancy.
IMPRESSION: No mammographic evidence of malignancy. A result letter of this
screening mammogram will be mailed directly to the patient.

RECOMMENDATION:
Screening mammogram in one year. (Code:51-O-LD2)

BI-RADS CATEGORY  1: Negative.

## 2023-01-23 ENCOUNTER — Other Ambulatory Visit: Payer: Self-pay | Admitting: Medical

## 2023-01-23 ENCOUNTER — Other Ambulatory Visit (INDEPENDENT_AMBULATORY_CARE_PROVIDER_SITE_OTHER): Payer: Medicare Other

## 2023-01-23 DIAGNOSIS — E559 Vitamin D deficiency, unspecified: Secondary | ICD-10-CM | POA: Diagnosis not present

## 2023-01-23 LAB — VITAMIN D 25 HYDROXY (VIT D DEFICIENCY, FRACTURES): VITD: 37.05 ng/mL (ref 30.00–100.00)

## 2023-01-24 ENCOUNTER — Encounter: Payer: Self-pay | Admitting: Medical Oncology

## 2023-01-24 ENCOUNTER — Inpatient Hospital Stay: Payer: Medicare Other | Attending: Medical Oncology | Admitting: Medical Oncology

## 2023-01-24 ENCOUNTER — Inpatient Hospital Stay: Payer: Medicare Other

## 2023-01-24 VITALS — BP 131/62 | HR 66 | Temp 98.2°F | Resp 17 | Ht 63.0 in | Wt 198.0 lb

## 2023-01-24 DIAGNOSIS — D6851 Activated protein C resistance: Secondary | ICD-10-CM | POA: Diagnosis not present

## 2023-01-24 NOTE — Progress Notes (Signed)
[facility] Telephone:(336) [phone]   Fax:([phone]  INITIAL CONSULT NOTE  Patient Care Team: Saguier, Kateri Mc as PCP - General (Internal Medicine)  CHIEF COMPLAINTS/PURPOSE OF CONSULTATION:  Factor V Leiden   HISTORY OF PRESENTING ILLNESS:  Miranda Cunningham 67 y.o. female is referred to our office by their PCP PA Saguier for a positive Factor V Leiden testing.    Patient reports that she had positive Factor V Leiden testing. This was performed due to her mother having a stroke and finding out that she had this condition. Additionally she has found out that many family members have this condition as well. Fortunately for patient she is asymptomatic. She has never had a known clotting event despite having many surgical procedures.   In terms of additional risk factors she does have hyperlipidemia for which she takes Lipitor. She does not smoke. She does not have diabetes. She is not on hormonal medication. No other bleeding or clotting disorders.   She works for the [employer]. She drive often for work but fortunately not long distance. She does also stand on concrete often for work.   No SOB, chest pain, calf pain Not currently on any blood thinner- No History GI bleed- no  MEDICAL HISTORY:  Past Medical History:  Diagnosis Date   Allergy    Hyperlipidemia     SURGICAL HISTORY: Past Surgical History:  Procedure Laterality Date   BREAST BIOPSY     BREAST EXCISIONAL BIOPSY Right    CARPAL TUNNEL RELEASE     LAPAROSCOPY     SHOULDER ARTHROSCOPY      SOCIAL HISTORY: Social History   Socioeconomic History   Marital status: Divorced    Spouse name: Not on file   Number of children: Not on file   Years of education: Not on file   Highest education level: Not on file  Occupational History   Not on file  Tobacco Use   Smoking status: Never   Smokeless tobacco: Never  Vaping Use   Vaping status: Never Used  Substance and Sexual  Activity   Alcohol use: Never   Drug use: Never   Sexual activity: Not Currently  Other Topics Concern   Not on file  Social History Narrative   Not on file   Social Determinants of Health   Financial Resource Strain: Patient Declined (01/24/2023)   Overall Financial Resource Strain (CARDIA)    Difficulty of Paying Living Expenses: Patient declined  Food Insecurity: Patient Declined (01/24/2023)   Hunger Vital Sign    Worried About Running Out of Food in the Last Year: Patient declined    Ran Out of Food in the Last Year: Patient declined  Transportation Needs: Patient Declined (01/24/2023)   PRAPARE - Administrator, Civil Service (Medical): Patient declined    Lack of Transportation (Non-Medical): Patient declined  Physical Activity: Patient Declined (01/24/2023)   Exercise Vital Sign    Days of Exercise per Week: Patient declined    Minutes of Exercise per Session: Patient declined  Stress: Patient Declined (01/24/2023)   [employer] of Occupational Health - Occupational Stress Questionnaire    Feeling of Stress : Patient declined  Social Connections: Patient Declined (01/24/2023)   Social Connection and Isolation Panel [NHANES]    Frequency of Communication with Friends and Family: Patient declined    Frequency of Social Gatherings with Friends and Family: Patient declined    Attends Religious Services: Patient declined    Active Member  of Clubs or Organizations: Patient declined    Attends Banker Meetings: Patient declined    Marital Status: Patient declined  Intimate Partner Violence: Patient Declined (01/24/2023)   Humiliation, Afraid, Rape, and Kick questionnaire    Fear of Current or Ex-Partner: Patient declined    Emotionally Abused: Patient declined    Physically Abused: Patient declined    Sexually Abused: Patient declined    FAMILY HISTORY: Family History  Problem Relation Age of Onset   Jaundice Mother    Cirrhosis Mother     Macular degeneration Mother    Factor V Leiden deficiency Sister    Factor V Leiden deficiency Maternal Aunt    Factor V Leiden deficiency Maternal Grandmother    Colon cancer Neg Hx    Colon polyps Neg Hx    Stomach cancer Neg Hx    Rectal cancer Neg Hx     ALLERGIES:  is allergic to gramineae pollens, methylpyrrolidone, and milk (cow).  MEDICATIONS:  Current Outpatient Medications  Medication Sig Dispense Refill   alendronate (FOSAMAX) 10 MG tablet Take 1 tablet (10 mg total) by mouth daily before breakfast. Take with a full glass of water on an empty stomach. 30 tablet 11   atorvastatin (LIPITOR) 10 MG tablet Take 1 tablet (10 mg total) by mouth daily. 90 tablet 3   busPIRone (BUSPAR) 7.5 MG tablet Take 1 tablet (7.5 mg total) by mouth 2 (two) times daily. 60 tablet 0   escitalopram (LEXAPRO) 20 MG tablet Take 1 tablet (20 mg total) by mouth daily. 30 tablet 11   furosemide (LASIX) 20 MG tablet Take 1 tablet (20 mg total) by mouth daily. 10 tablet 0   Iodine Strong, Lugols, (IODINE STRONG PO) Take by mouth.     Loratadine (CLARITIN PO) Take by mouth.     Multiple Vitamins-Minerals (EYE VITAMINS PO) Take by mouth.     Vitamin D, Ergocalciferol, (DRISDOL) 1.25 MG (50000 UNIT) CAPS capsule Take 1 capsule by mouth once a week 8 capsule 0   No current facility-administered medications for this visit.    REVIEW OF SYSTEMS:   Constitutional: ( - ) fevers, ( - )  chills , ( - ) night sweats Eyes: ( - ) blurriness of vision, ( - ) double vision, ( - ) watery eyes Ears, nose, mouth, throat, and face: ( - ) mucositis, ( - ) sore throat Respiratory: ( - ) cough, ( - ) dyspnea, ( - ) wheezes Cardiovascular: ( - ) palpitation, ( - ) chest discomfort, ( - ) lower extremity swelling Gastrointestinal:  ( - ) nausea, ( - ) heartburn, ( - ) change in bowel habits Skin: ( - ) abnormal skin rashes Lymphatics: ( - ) new lymphadenopathy, ( - ) easy bruising Neurological: ( - ) numbness, ( - )  tingling, ( - ) new weaknesses Behavioral/Psych: ( - ) mood change, ( - ) new changes  All other systems were reviewed with the patient and are negative.  PHYSICAL EXAMINATION: ECOG PERFORMANCE STATUS: 1 - Symptomatic but completely ambulatory  Vitals:   01/24/23 1259  BP: 131/62  Pulse: 66  Resp: 17  Temp: 98.2 F (36.8 C)  SpO2: 96%   Filed Weights   01/24/23 1259  Weight: 198 lb (89.8 kg)    GENERAL: well appearing female in NAD  SKIN: skin color, texture, turgor are normal, no rashes or significant lesions EYES: conjunctiva are pink and non-injected, sclera clear OROPHARYNX: no exudate, no erythema; lips,  buccal mucosa, and tongue normal  NECK: supple, non-tender LYMPH:  no palpable lymphadenopathy in the cervical, axillary or supraclavicular lymph nodes.  LUNGS: clear to auscultation and percussion with normal breathing effort HEART: regular rate & rhythm and no murmurs and no lower extremity edema ABDOMEN: soft, non-tender, non-distended, normal bowel sounds Musculoskeletal: no cyanosis of digits and no clubbing  PSYCH: alert & oriented x 3, fluent speech NEURO: no focal motor/sensory deficits  LABORATORY DATA:  Pending   ASSESSMENT & PLAN Miranda Cunningham is a 67 y.o. female who was referred to [place] for a positive Factor V Leiden test and strong family history.   Review of external labs from 12/19/2022 show that she is heterozygous for the mutation. This is good news as it is not as hazardous to her health. We discussed this along with the importance of risk reduction for blood clots. We discussed things such as staying hydrated with water, using compression socks, taking an 81 mg asa daily if well tolerated and alerting [place] for any elective surgery so we can best plan to reduce her risks for a clotting event.   We also discussed red flag signs and symptoms for blood clots. Should she experience any of these she will seek immediate medical attention.   Overall we will see  her once yearly or sooner as needed. No additional labs needed today.   All questions were answered. The patient knows to call the clinic with any problems, questions or concerns.  I have spent a total of 30 minutes minutes of face-to-face and non-face-to-face time, preparing to see the patient, obtaining and/or reviewing separately obtained history, performing a medically appropriate examination, counseling and educating the patient, ordering medications/tests/procedures, referring and communicating with other health care professionals, documenting clinical information in the electronic health record, independently interpreting results and communicating results to the patient, and care coordination.    Clent Jacks PA-C Department of Hematology/Oncology [facility] at [facility]

## 2023-01-24 NOTE — Patient Instructions (Signed)
Start a 81 mg aspirin once daily with food Mens or wide calf graduated compression socks (Knee high) Hydration with water Let me know if you are having any surgery

## 2023-08-15 ENCOUNTER — Telehealth: Payer: Self-pay | Admitting: Medical

## 2023-08-15 NOTE — Telephone Encounter (Signed)
 Copied from CRM [id number]. Topic: Medicare AWV >> Aug 15, 2023  2:35 PM Nathanel DEL wrote: Reason for CRM: LVM 08/15/2023 to schedule AWV. Please schedule Virtual or Telehealth visits ONLY.   Nathanel Paschal; Care Guide Ambulatory Clinical Support [place] [facility] Direct Dial: [phone]

## 2023-12-26 ENCOUNTER — Other Ambulatory Visit (HOSPITAL_BASED_OUTPATIENT_CLINIC_OR_DEPARTMENT_OTHER): Payer: Self-pay | Admitting: Medical

## 2023-12-26 DIAGNOSIS — Z1231 Encounter for screening mammogram for malignant neoplasm of breast: Secondary | ICD-10-CM

## 2024-01-03 ENCOUNTER — Ambulatory Visit (HOSPITAL_BASED_OUTPATIENT_CLINIC_OR_DEPARTMENT_OTHER)
Admission: RE | Admit: 2024-01-03 | Discharge: 2024-01-03 | Disposition: A | Discharge status: Home / Self Care | Source: Ambulatory Visit | Attending: Medical | Admitting: Medical

## 2024-01-03 ENCOUNTER — Encounter (HOSPITAL_BASED_OUTPATIENT_CLINIC_OR_DEPARTMENT_OTHER): Payer: Self-pay

## 2024-01-03 DIAGNOSIS — Z1231 Encounter for screening mammogram for malignant neoplasm of breast: Secondary | ICD-10-CM | POA: Diagnosis present

## 2024-01-28 ENCOUNTER — Inpatient Hospital Stay: Payer: Medicare Other | Attending: Medical Oncology | Admitting: Medical Oncology

## 2024-01-28 ENCOUNTER — Encounter: Payer: Self-pay | Admitting: Medical Oncology

## 2024-01-28 VITALS — BP 136/79 | HR 69 | Temp 97.9°F | Resp 18 | Ht 62.99 in | Wt 192.0 lb

## 2024-01-28 DIAGNOSIS — Z832 Family history of diseases of the blood and blood-forming organs and certain disorders involving the immune mechanism: Secondary | ICD-10-CM | POA: Insufficient documentation

## 2024-01-28 DIAGNOSIS — D6851 Activated protein C resistance: Secondary | ICD-10-CM | POA: Diagnosis present

## 2024-01-28 DIAGNOSIS — L989 Disorder of the skin and subcutaneous tissue, unspecified: Secondary | ICD-10-CM

## 2024-01-28 NOTE — Progress Notes (Signed)
 [facility] Telephone:(336) [phone]   Fax:(336) [phone]  Office Visit Progress Note  Patient Care Team: Saguier, [place] RIGGERS as PCP - General (Internal Medicine)  CHIEF COMPLAINTS/PURPOSE OF CONSULTATION:  Factor V Leiden   HISTORY OF PRESENTING ILLNESS:  Miranda Cunningham 68 y.o. female is gently monitored by our office for her history of positive Factor V Leiden mutation.   Today she is fair- her mother passed recently after a fall and is still grieving this loss.   She also asks about a bump in her groin area that she just noticed. She is unsure how long it has been present. It is not painful and does not have any discharge or bleeding. No history of melanoma. She has not tried to treat this area.   Fortunately for patient she is asymptomatic. She has never had a known clotting event despite having many surgical procedures.   In terms of additional risk factors she does have hyperlipidemia for which she takes Lipitor. She does not smoke. She does not have diabetes. She is not on hormonal medication. No other bleeding or clotting disorders.   She works for the [employer]. She drive often for work but fortunately not long distance.   No SOB, chest pain, calf pain Not currently on any blood thinner- No History GI bleed- no  MEDICAL HISTORY:  Past Medical History:  Diagnosis Date   Allergy    Hyperlipidemia     SURGICAL HISTORY: Past Surgical History:  Procedure Laterality Date   BREAST BIOPSY     BREAST EXCISIONAL BIOPSY Right    CARPAL TUNNEL RELEASE     LAPAROSCOPY     SHOULDER ARTHROSCOPY      SOCIAL HISTORY: Social History   Socioeconomic History   Marital status: Divorced    Spouse name: Not on file   Number of children: Not on file   Years of education: Not on file   Highest education level: Not on file  Occupational History   Not on file  Tobacco Use   Smoking status: Never   Smokeless tobacco: Never  Vaping Use   Vaping  status: Never Used  Substance and Sexual Activity   Alcohol use: Never   Drug use: Never   Sexual activity: Not Currently  Other Topics Concern   Not on file  Social History Narrative   Not on file   Social Drivers of Health   Tobacco Use: Low Risk (01/28/2024)   Patient History    Smoking Tobacco Use: Never    Smokeless Tobacco Use: Never    Passive Exposure: Not on file  Financial Resource Strain: Patient Declined (01/24/2023)   Overall Financial Resource Strain (CARDIA)    Difficulty of Paying Living Expenses: Patient declined  Food Insecurity: Patient Declined (01/24/2023)   Hunger Vital Sign    Worried About Running Out of Food in the Last Year: Patient declined    Ran Out of Food in the Last Year: Patient declined  Transportation Needs: Patient Declined (01/24/2023)   PRAPARE - Administrator, Civil Service (Medical): Patient declined    Lack of Transportation (Non-Medical): Patient declined  Physical Activity: Patient Declined (01/24/2023)   Exercise Vital Sign    Days of Exercise per Week: Patient declined    Minutes of Exercise per Session: Patient declined  Stress: Patient Declined (01/24/2023)   [employer] of Occupational Health - Occupational Stress Questionnaire    Feeling of Stress : Patient declined  Social Connections: Patient Declined (  01/24/2023)   Social Connection and Isolation Panel    Frequency of Communication with Friends and Family: Patient declined    Frequency of Social Gatherings with Friends and Family: Patient declined    Attends Religious Services: Patient declined    Active Member of Clubs or Organizations: Patient declined    Attends Banker Meetings: Patient declined    Marital Status: Patient declined  Intimate Partner Violence: Patient Declined (01/24/2023)   Humiliation, Afraid, Rape, and Kick questionnaire    Fear of Current or Ex-Partner: Patient declined    Emotionally Abused: Patient declined     Physically Abused: Patient declined    Sexually Abused: Patient declined  Depression (PHQ2-9): Low Risk (01/28/2024)   Depression (PHQ2-9)    PHQ-2 Score: 0  Alcohol Screen: Not on file  Housing: Patient Declined (01/24/2023)   Housing    Last Housing Risk Score: 98  Utilities: Patient Declined (01/24/2023)   [employer] Utilities    Threatened with loss of utilities: Patient declined  Health Literacy: Patient Declined (01/24/2023)   B1300 Health Literacy    Frequency of need for help with medical instructions: Patient declines to respond    FAMILY HISTORY: Family History  Problem Relation Age of Onset   Jaundice Mother    Cirrhosis Mother    Macular degeneration Mother    Factor V Leiden deficiency Sister    Factor V Leiden deficiency Maternal Aunt    Factor V Leiden deficiency Maternal Grandmother    Colon cancer Neg Hx    Colon polyps Neg Hx    Stomach cancer Neg Hx    Rectal cancer Neg Hx     ALLERGIES:  is allergic to gramineae pollens, methylpyrrolidone, and milk (cow).  MEDICATIONS:  Current Outpatient Medications  Medication Sig Dispense Refill   alendronate  (FOSAMAX ) 10 MG tablet Take 1 tablet (10 mg total) by mouth daily before breakfast. Take with a full glass of water on an empty stomach. 30 tablet 11   atorvastatin  (LIPITOR) 10 MG tablet Take 1 tablet (10 mg total) by mouth daily. 90 tablet 3   BABY ASPIRIN PO Take 81 mg by mouth daily.     busPIRone  (BUSPAR ) 7.5 MG tablet Take 1 tablet (7.5 mg total) by mouth 2 (two) times daily. 60 tablet 0   escitalopram  (LEXAPRO ) 20 MG tablet Take 1 tablet (20 mg total) by mouth daily. 30 tablet 11   furosemide  (LASIX ) 20 MG tablet Take 1 tablet (20 mg total) by mouth daily. 10 tablet 0   Iodine Strong, Lugols, (IODINE STRONG PO) Take by mouth.     Loratadine (CLARITIN PO) Take by mouth.     Multiple Vitamins-Minerals (EYE VITAMINS PO) Take by mouth.     Vitamin D , Ergocalciferol , (DRISDOL ) 1.25 MG (50000 UNIT) CAPS capsule Take  1 capsule by mouth once a week 8 capsule 0   No current facility-administered medications for this visit.    REVIEW OF SYSTEMS:   Constitutional: ( - ) fevers, ( - )  chills , ( - ) night sweats Eyes: ( - ) blurriness of vision, ( - ) double vision, ( - ) watery eyes Ears, nose, mouth, throat, and face: ( - ) mucositis, ( - ) sore throat Respiratory: ( - ) cough, ( - ) dyspnea, ( - ) wheezes Cardiovascular: ( - ) palpitation, ( - ) chest discomfort, ( - ) lower extremity swelling Gastrointestinal:  ( - ) nausea, ( - ) heartburn, ( - ) change in bowel  habits Skin: ( - ) abnormal skin rashes Lymphatics: ( - ) new lymphadenopathy, ( - ) easy bruising Neurological: ( - ) numbness, ( - ) tingling, ( - ) new weaknesses Behavioral/Psych: ( - ) mood change, ( - ) new changes  All other systems were reviewed with the patient and are negative.  PHYSICAL EXAMINATION: ECOG PERFORMANCE STATUS: 1 - Symptomatic but completely ambulatory  Vitals:   01/28/24 0905  BP: 136/79  Pulse: 69  Resp: 18  Temp: 97.9 F (36.6 C)  SpO2: 98%   Filed Weights   01/28/24 0905  Weight: 192 lb (87.1 kg)    GENERAL: well appearing female in NAD  SKIN: skin color, texture, turgor are normal, no rashes or significant lesions EYES: conjunctiva are pink and non-injected, sclera clear OROPHARYNX: no exudate, no erythema; lips, buccal mucosa, and tongue normal  NECK: supple, non-tender LYMPH:  no palpable lymphadenopathy in the cervical, axillary or supraclavicular lymph nodes.  LUNGS: clear to auscultation and percussion with normal breathing effort HEART: regular rate & rhythm and no murmurs and no lower extremity edema GU: Chaperone declined. There is a 5mm round superficial waxy cyst like lesion of her left labia majora. No bleeding, ulcers or discharge.  PSYCH: alert & oriented x 3, fluent speech NEURO: no focal motor/sensory deficits  LABORATORY DATA:  Pending   ASSESSMENT & PLAN Miranda Cunningham is a  68 y.o. female who was referred to us  for a positive Factor V Leiden test and strong family history.   Review of external labs from 12/19/2022 show that she is heterozygous for the mutation. This is good news as it is not as hazardous to her health. We discussed this along with the importance of risk reduction for blood clots. We reviewed prevention measures such as staying hydrated with water, using compression socks, taking an 81 mg asa daily if well tolerated and alerting us  for any elective surgery so we can best plan to reduce her risks for a clotting event.   We reviewed red flag signs and symptoms for blood clots. Should she experience any of these she will seek immediate medical attention.   In terms of the lesion of her groin I suspect that this is either a cyst or a lipid deposit. I have asked that she consider having this assessed by a dermatologist or GYN to confirm. Fortunately it does not appear harmful.   We will plan to see her in 1 year (no labs needed)  All questions were answered. The patient knows to call the clinic with any problems, questions or concerns.  I have spent a total of 30 minutes minutes of face-to-face and non-face-to-face time, preparing to see the patient, obtaining and/or reviewing separately obtained history, performing a medically appropriate examination, counseling and educating the patient, ordering medications/tests/procedures, referring and communicating with other health care professionals, documenting clinical information in the electronic health record, independently interpreting results and communicating results to the patient, and care coordination.    Lauraine Dais PA-C Department of Hematology/Oncology [facility] at [facility]

## 2025-01-26 ENCOUNTER — Inpatient Hospital Stay: Admitting: Medical Oncology
